# Patient Record
Sex: Female | Born: 1935 | Race: White | Hispanic: No | Smoking: Never smoker
Health system: Southern US, Community
[De-identification: ages and names within clinical notes are randomized; demographics above are authoritative.]

## PROBLEM LIST (undated history)

## (undated) DIAGNOSIS — E785 Hyperlipidemia, unspecified: Secondary | ICD-10-CM

## (undated) DIAGNOSIS — F32A Depression, unspecified: Secondary | ICD-10-CM

## (undated) DIAGNOSIS — K219 Gastro-esophageal reflux disease without esophagitis: Secondary | ICD-10-CM

## (undated) DIAGNOSIS — F329 Major depressive disorder, single episode, unspecified: Secondary | ICD-10-CM

## (undated) DIAGNOSIS — I10 Essential (primary) hypertension: Secondary | ICD-10-CM

## (undated) DIAGNOSIS — M1990 Unspecified osteoarthritis, unspecified site: Secondary | ICD-10-CM

## (undated) DIAGNOSIS — M810 Age-related osteoporosis without current pathological fracture: Secondary | ICD-10-CM

## (undated) HISTORY — DX: Depression, unspecified: F32.A

## (undated) HISTORY — DX: Essential (primary) hypertension: I10

## (undated) HISTORY — DX: Age-related osteoporosis without current pathological fracture: M81.0

## (undated) HISTORY — DX: Major depressive disorder, single episode, unspecified: F32.9

## (undated) HISTORY — DX: Gastro-esophageal reflux disease without esophagitis: K21.9

## (undated) HISTORY — DX: Hyperlipidemia, unspecified: E78.5

## (undated) HISTORY — DX: Unspecified osteoarthritis, unspecified site: M19.90

---

## 1983-09-30 HISTORY — PX: BRAIN SURGERY: SHX531

## 2000-05-26 ENCOUNTER — Other Ambulatory Visit: Admission: RE | Admit: 2000-05-26 | Discharge: 2000-05-26 | Payer: Self-pay | Admitting: Internal Medicine

## 2002-06-08 ENCOUNTER — Encounter: Payer: Self-pay | Admitting: Emergency Medicine

## 2002-06-08 ENCOUNTER — Emergency Department (HOSPITAL_COMMUNITY): Admission: EM | Admit: 2002-06-08 | Discharge: 2002-06-08 | Payer: Self-pay | Admitting: Emergency Medicine

## 2005-05-12 ENCOUNTER — Ambulatory Visit: Payer: Self-pay | Admitting: Gastroenterology

## 2005-05-18 ENCOUNTER — Emergency Department (HOSPITAL_COMMUNITY): Admission: EM | Admit: 2005-05-18 | Discharge: 2005-05-18 | Payer: Self-pay | Admitting: Emergency Medicine

## 2005-06-04 ENCOUNTER — Ambulatory Visit: Payer: Self-pay | Admitting: Gastroenterology

## 2005-06-04 ENCOUNTER — Encounter (INDEPENDENT_AMBULATORY_CARE_PROVIDER_SITE_OTHER): Payer: Self-pay | Admitting: Specialist

## 2008-05-23 ENCOUNTER — Ambulatory Visit: Payer: Self-pay | Admitting: Gastroenterology

## 2008-06-07 ENCOUNTER — Encounter: Payer: Self-pay | Admitting: Gastroenterology

## 2008-06-07 ENCOUNTER — Ambulatory Visit: Payer: Self-pay | Admitting: Gastroenterology

## 2008-06-07 LAB — HM COLONOSCOPY

## 2008-06-09 ENCOUNTER — Encounter: Payer: Self-pay | Admitting: Gastroenterology

## 2010-10-25 ENCOUNTER — Other Ambulatory Visit: Payer: Self-pay | Admitting: Dermatology

## 2012-01-30 ENCOUNTER — Other Ambulatory Visit: Payer: Self-pay | Admitting: Dermatology

## 2013-01-06 ENCOUNTER — Other Ambulatory Visit: Payer: Self-pay | Admitting: Dermatology

## 2013-10-19 ENCOUNTER — Encounter: Payer: Self-pay | Admitting: Podiatry

## 2013-10-19 ENCOUNTER — Ambulatory Visit (INDEPENDENT_AMBULATORY_CARE_PROVIDER_SITE_OTHER): Payer: Medicare Other | Admitting: Podiatry

## 2013-10-19 VITALS — BP 141/86 | HR 64 | Resp 12

## 2013-10-19 DIAGNOSIS — L84 Corns and callosities: Secondary | ICD-10-CM

## 2013-10-19 NOTE — Progress Notes (Signed)
Patient ID: Breanna Yang, female   DOB: [birth year], 80 y.o.   MRN: [id number]  Subjective: This orientated x3 white female presents complaining of painful corns on  The right and left feet.  Objective: Hyperkeratotic tissue noted on the right hallux and second right toe. Hyperkeratotic tissue also present on left hallux, second toe, third toe fourth toe.  Assessment: Multiple hyperkeratotic lesions right and left toes.  Plan: All hyperkeratotic lesions were debrided back without a bleeding. Patient wears toe separators and will continue to do so.  Reappoint at patient's request.

## 2014-03-13 ENCOUNTER — Encounter: Payer: Self-pay | Admitting: Podiatry

## 2014-03-13 ENCOUNTER — Ambulatory Visit (INDEPENDENT_AMBULATORY_CARE_PROVIDER_SITE_OTHER): Payer: Medicare Other | Admitting: Podiatry

## 2014-03-13 VITALS — BP 130/79 | HR 84 | Resp 18

## 2014-03-13 DIAGNOSIS — L84 Corns and callosities: Secondary | ICD-10-CM

## 2014-03-13 NOTE — Progress Notes (Signed)
Patient ID: Breanna Yang, female   DOB: 05-Jul-1934, 80 y.o.   MRN: [id number] Subjective: Orientated x3 white female  Objective: Hyperkeratoses medial second left toe and lateral aspect of the left hallux with hemorrhagic areas.  Assessment: Keratoses x2  Plan: Debrided keratoses x2 Pad lesion second left toe  Reappoint as needed at patient's request

## 2014-06-21 ENCOUNTER — Ambulatory Visit (INDEPENDENT_AMBULATORY_CARE_PROVIDER_SITE_OTHER): Payer: Medicare Other | Admitting: Podiatry

## 2014-06-21 DIAGNOSIS — L84 Corns and callosities: Secondary | ICD-10-CM

## 2014-06-22 NOTE — Progress Notes (Signed)
Patient ID: Breanna Yang, female   DOB: 03-Oct-1933, 80 y.o.   MRN: [id number]  Subjective: This patient presents complaining of painful keratoses on the hallux and second toes bilaterally  Objective: Keratoses noted on the medial lateral borders of the hallux and second toes bilaterally  Assessment: Keratoses x4  Plan: Debrided keratoses x4 Apply foam pads to the second toes bilaterally and attach with one-inch coflex  Tape Instructed patient how to apply foam pads and attach with Coflex tape and the size importance of not over tightening the tape.  Reappoint at patient's request

## 2014-10-03 ENCOUNTER — Encounter: Payer: Self-pay | Admitting: Rehabilitative and Restorative Service Providers"

## 2014-10-03 ENCOUNTER — Ambulatory Visit: Payer: Medicare Other | Attending: Internal Medicine | Admitting: Rehabilitative and Restorative Service Providers"

## 2014-10-03 DIAGNOSIS — R269 Unspecified abnormalities of gait and mobility: Secondary | ICD-10-CM | POA: Diagnosis present

## 2014-10-03 NOTE — Addendum Note (Signed)
Addended by: Rudell Cobb M on: 10/03/2014 02:42 PM   Modules accepted: Orders

## 2014-10-03 NOTE — Therapy (Signed)
[facility] [address] [place], [place], [address] Phone: [phone]   Fax:  [phone]  Physical Therapy Evaluation  Patient Details  Name: Breanna Yang MRN: [id number] Date of Birth: 1934/03/05  Encounter Date: 10/03/2014      PT End of Session - 10/03/14 1412    Visit Number 1  G code (1)   Number of Visits 8   Date for PT Re-Evaluation 11/03/14   PT Start Time 1020   PT Stop Time 1105   PT Time Calculation (min) 45 min   Equipment Utilized During Treatment Gait belt   Activity Tolerance Patient tolerated treatment well      Past Medical History  Diagnosis Date  . Hyperlipidemia     Past Surgical History  Procedure Laterality Date  . Brain surgery  1985    anneurysm    There were no vitals taken for this visit.  Visit Diagnosis:  Abnormality of gait      Subjective Assessment - 10/03/14 1026    Symptoms The patient describes dizziness as a backwards pulling sensation initially felt in November when standing outside on unlevel surfaces.  She  reports veering during ambulation.  She denies dizziness when lying down in bed or getting up.  She describes this as an intermittent sensation that comes and goes.  She reports she had experienced some dizziness initially after her aneurysm, but that had improved.  She reports frequent headache since aneurysm repair.   Patient Stated Goals Patient stopped driving and wants to find out what is wrong.   Currently in Pain? No.   Pain Score 0-No pain          [facility] PT Assessment - 10/03/14 1038    Assessment   Medical Diagnosis dizziness   Onset Date --  07/2014   Balance Screen   Has the patient fallen in the past 6 months No   Has the patient had a decrease in activity level because of a fear of falling?  No   Is the patient reluctant to leave their home because of a fear of falling?  No   Home Environment   Living Enviornment Private residence   Living Arrangements Alone  husband passed away Aug 28, 2014   Type of [place] to enter   Entrance Stairs-Number of Steps 2   Entrance Stairs-Rails Right   Home Layout One level   Prior Function   Level of Independence Independent with basic ADLs;Independent with homemaking with ambulation   Observation/Other Assessments   Focus on Therapeutic Outcomes (FOTO)  73%   Ambulation/Gait   Ambulation/Gait Yes   Ambulation/Gait Assistance 7: Independent   Ambulation Distance (Feet) 200 Feet   Gait Pattern Wide base of support  veering from midline, inconsistent steps   Gait velocity 1.71 ft/sec   Stairs Yes   Stairs Assistance 6: Modified independent (Device/Increase time)   Stair Management Technique One rail Right;Alternating pattern   Number of Stairs --  4   Standardized Balance Assessment   Standardized Balance Assessment Berg Balance Test   Berg Balance Test   Sit to Stand Able to stand without using hands and stabilize independently   Standing Unsupported Able to stand safely 2 minutes   Sitting with Back Unsupported but Feet Supported on Floor or Stool Able to sit safely and securely 2 minutes   Stand to Sit Sits safely with minimal use of hands   Transfers Able to transfer  safely, minor use of hands   Standing Unsupported with Eyes Closed Able to stand 3 seconds   Standing Ubsupported with Feet Together Needs help to attain position but able to stand for 30 seconds with feet together   From Standing, Reach Forward with Outstretched Arm Can reach forward >12 cm safely (5")   From Standing Position, Pick up Object from [place] to pick up shoe safely and easily   From Standing Position, Turn to Look Behind Over each Shoulder Looks behind one side only/other side shows less weight shift   Turn 360 Degrees Needs close supervision or verbal cueing   Standing Unsupported, Alternately Place Feet on Step/Stool Able to complete 4 steps without aid or supervision    Standing Unsupported, One Foot in Front Able to take small step independently and hold 30 seconds   Standing on One Leg Tries to lift leg/unable to hold 3 seconds but remains standing independently   Total Score 39/56 indicating high fall risk            Vestibular Assessment - 10/03/14 1038    General Observation --  Absent R hearing from anneurysm   Type of Dizziness Imbalance  unsteady with head/body turns   Frequency of Dizziness --  intermittent, episodic   Duration of Dizziness --  hours   Aggravating Factors Turning body quickly;Turning head quickly   Relieving Factors Rest   Occulomotor Alignment Abnormal  glasses with prisms due to vision impairment s/p anneurysm   Gaze-induced Right beating nystagmus with R gaze   Smooth Pursuits --  limited ocular ROM, pt moves head to compensate   VOR 1 Head Only (x 1 viewing) --  limited ROM with head turns while maintaining gaze on target   VOR Cancellation Unable to maintain gaze  "fuzzy" sensation with vision during testing   Comment slow pace   Dix-Hallpike Dix-Hallpike Right;Dix-Hallpike Left   Sidelying Test Sidelying Right;Sidelying Left   Horizontal Canal Testing Horizontal Canal Right;Horizontal Canal Left   Dix-Hallpike Right Symptoms No nystagmus  unless in R gaze   Dix-Hallpike Left Symptoms No nystagmus   Sidelying Right Duration --  with R gaze; no subjective reports of dizziness   Sidelying Right Symptoms Upbeat, right rotatory nystagmus  only in R eye noted, only with R gaze   Sidelying Left Symptoms No nystagmus  no symptoms   Horizontal Canal Right Symptoms Ageotrophic  mild nystagmus noted, no dizziness reported   Horizontal Canal Left Symptoms Ageotrophic  "not as clear" with L horizontal roll, indicates R horiz cupulolithiasis             PT Education - 10/03/14 1412    Education provided Yes   Education Details PT goals and plan of care.   Person(s) Educated Patient   Methods Explanation    Comprehension Verbalized understanding          PT Short Term Goals - 10/03/14 1429    PT SHORT TERM GOAL #1   Title The patient will be indep with HEP for balance, mobility.  Target date 11/02/2014   Time 4   Period Weeks   PT SHORT TERM GOAL #2   Title The patient will improve Berg score to > or equal to 44/56 to demo decreasing risk for falls.  Target date 11/02/2014   Time 4   Period Weeks   PT SHORT TERM GOAL #3   Title The patient will improve gait speed to > or equal to 2.0 ft/sec to demo  decreasing risk for falls.  Target date 11/02/2014   Time 4   Period Weeks   PT SHORT TERM GOAL #4   Title The patient will be further assessed on sensory organization testing and goal to follow, if indicated.  Target date 11/02/2014   Time 4   Period Weeks           PT Long Term Goals - 10/04/2014 1431    PT LONG TERM GOAL #1   Title The patient will be indep with post d/c HEP.  Target date 3/4/206   Time 8   Period Weeks   PT LONG TERM GOAL #2   Title The patient will improve Berg score to > or equal to 48/56 to demo decreased risk for falls.  Target date 12/01/2014   Time 8   Period Weeks   PT LONG TERM GOAL #3   Title The patient will improve gait speed to > or equal to 2.3 ft/sec to demo improving functional mobility.  Target date 12/01/2014   Time 8   Period Weeks   PT LONG TERM GOAL #4   Title The patient will negotiate unlevel surfaces without a device independently for return to prior functional status without loss of balance.  Target date 12/01/2014   Time 8   Period Weeks               Plan - 10/04/2014 1414    Clinical Impression Statement The patient is a 81 yo female with h/o brain aneurysm 1985 presenting today with recent reports of declining balance and intermittent episodes of imbalance, described as dizziness.  She reports recent increase in stress due to her husband passing away in October and her daughter going through hard times.  She also has visual limitations  (in ROM, smooth pursuits and nystagmus with R end gaze) compensated for with use of prisms in her lenses.  PT was able to provoke an ageotropic nystagmus with L horizontal rolling, however this did not correlate with subjective reports of dizziness and may be due to abnormal visual exam. The patient appears to have a mix of central factors (abnormal visual exam), possible peripheral vertigo (noted by nystagmus with L horizontal roll test) and central decompensation from prior brain surgery due to stress creating worsening balance and declining gait speed.   The patient requests PT to perform 1x/week sessions due to her not currently driving.   Pt will benefit from skilled therapeutic intervention in order to improve on the following deficits Abnormal gait;Difficulty walking;Decreased balance;Decreased mobility;Decreased strength   Rehab Potential Good   Clinical Impairments Affecting Rehab Potential h/o brain surgery   PT Frequency 2x / week  however pt able to come 1x/week   PT Duration 8 weeks (longer duration if needed due to 1x/week freq)   PT Treatment/Interventions Therapeutic activities;Patient/family education;Therapeutic exercise;Gait training;Balance training;Neuromuscular re-education;Functional mobility training   PT Next Visit Plan HEP: gaze x 1 viewing, habituation horizontal rolling, standing balance (per Merrilee Jansky) activities, gait activities, SOT   Consulted and Agree with Plan of Care Patient           G-Codes - 04-Oct-2014 1433    Functional Assessment Tool Used Berg=39/56, gait speed=1.71 ft/sec   Functional Limitation Mobility: Walking and moving around   Mobility: Walking and Moving Around Current Status (P7106) At least 20 percent but less than 40 percent impaired, limited or restricted   Mobility: Walking and Moving Around Goal Status (Y6948) At least 1 percent but less than 20 percent  impaired, limited or restricted       Problem List There are no active problems to display  for this patient.   [place], [place] 10/03/2014, 2:35 PM  [facility] [address] [place], [place], [address] Phone: [phone]   Fax:  [phone]

## 2014-10-13 ENCOUNTER — Encounter: Payer: Medicare Other | Admitting: Rehabilitative and Restorative Service Providers"

## 2014-10-16 ENCOUNTER — Encounter: Payer: Medicare Other | Admitting: Rehabilitative and Restorative Service Providers"

## 2014-10-16 DIAGNOSIS — R269 Unspecified abnormalities of gait and mobility: Secondary | ICD-10-CM | POA: Diagnosis not present

## 2014-10-16 NOTE — Therapy (Signed)
[facility] [address] [place], [place], [address] Phone: [phone]   Fax:  [phone]  Patient Details  Name: Breanna Yang MRN: [id number] Date of Birth: [birth year] Referring Provider:  No ref. provider found  Encounter Date: 10/16/2014  PHYSICAL THERAPY DISCHARGE SUMMARY  Visits from Start of Care: eval only  Current functional level related to goals / functional outcomes: *See evaluation for patient status-she did not return due to difficulties with transportation.  Pt scored in high fall risk category during evaluation.   Remaining deficits: See evaluation for patient status.   Education / Equipment: n/a  Plan: Patient agrees to discharge.  Patient goals were not met. Patient is being discharged due to not returning since the last visit.  ?????    Thank you for the referral of this patient.    [place], PT 10/16/2014, 1:46 PM  [facility] [address] [place], [place], [address] Phone: [phone]   Fax:  [phone]

## 2014-10-20 ENCOUNTER — Encounter: Payer: Medicare Other | Admitting: Rehabilitative and Restorative Service Providers"

## 2014-10-27 ENCOUNTER — Encounter: Payer: Medicare Other | Admitting: Rehabilitative and Restorative Service Providers"

## 2015-03-13 ENCOUNTER — Ambulatory Visit (INDEPENDENT_AMBULATORY_CARE_PROVIDER_SITE_OTHER): Payer: Medicare Other | Admitting: Podiatry

## 2015-03-13 ENCOUNTER — Encounter: Payer: Self-pay | Admitting: Podiatry

## 2015-03-13 DIAGNOSIS — L84 Corns and callosities: Secondary | ICD-10-CM

## 2015-03-13 NOTE — Patient Instructions (Signed)
Apply antibody biotic ointment to the second right toe daily and cover with a Band-Aid until a scab forms

## 2015-03-13 NOTE — Progress Notes (Signed)
Patient ID: Breanna Yang, female   DOB: April 10, 1934, 81 y.o.   MRN: [id number]  Subjective: This patient presents complaining of painful keratoses on the right and left toes. She wears toe separators on a continuous basis  Objective: Right foot Keratoses medial second right toe  Left foot Keratoses lateral left hallux Medial & lateral second  Lateral third  Medial fourth  Assessment: Keratoses 6  Plan: Debrided keratoses 6 Slight bleeding second right toe treated with anabolic ointment and Band-Aid. Patient made aware of this and was advised to continue applying topical anabolic ointment and Band-Aid until a scab forms.  Reappoint at patient's request

## 2015-04-13 ENCOUNTER — Other Ambulatory Visit (HOSPITAL_COMMUNITY): Payer: Self-pay | Admitting: Internal Medicine

## 2015-04-13 ENCOUNTER — Ambulatory Visit (HOSPITAL_COMMUNITY)
Admission: RE | Admit: 2015-04-13 | Discharge: 2015-04-13 | Disposition: A | Discharge status: Home / Self Care | Payer: Medicare Other | Source: Ambulatory Visit | Attending: Internal Medicine | Admitting: Internal Medicine

## 2015-04-13 DIAGNOSIS — Z1231 Encounter for screening mammogram for malignant neoplasm of breast: Secondary | ICD-10-CM

## 2015-04-13 LAB — HM MAMMOGRAPHY

## 2015-09-07 LAB — BASIC METABOLIC PANEL
BUN: 28 mg/dL — AB (ref 4–21)
CREATININE: 0.9 mg/dL (ref <=1.1)
Glucose: 95 mg/dL
Potassium: 4.4 mmol/L (ref 3.4–5.3)
SODIUM: 149 mmol/L — AB (ref 137–147)

## 2015-09-07 LAB — CBC AND DIFFERENTIAL
HCT: 36 % (ref 36–46)
HEMOGLOBIN: 12.2 g/dL (ref 12.0–16.0)
Platelets: 196 10*3/uL (ref 150–399)
WBC: 5.1 10^3/mL

## 2015-09-07 LAB — HEPATIC FUNCTION PANEL
ALT: 26 U/L (ref 7–35)
AST: 30 U/L (ref 13–35)
Alkaline Phosphatase: 58 U/L (ref 25–125)
BILIRUBIN, TOTAL: 0.5 mg/dL

## 2015-09-07 LAB — LIPID PANEL
Cholesterol: 171 mg/dL (ref 0–200)
HDL: 49 mg/dL (ref 35–70)
LDL Cholesterol: 100 mg/dL
TRIGLYCERIDES: 111 mg/dL (ref 40–160)

## 2015-11-16 ENCOUNTER — Encounter: Payer: Self-pay | Admitting: Gastroenterology

## 2016-04-11 ENCOUNTER — Encounter: Payer: Self-pay | Admitting: General Practice

## 2016-06-20 ENCOUNTER — Other Ambulatory Visit: Payer: Self-pay | Admitting: Family Medicine

## 2016-06-20 DIAGNOSIS — Z1231 Encounter for screening mammogram for malignant neoplasm of breast: Secondary | ICD-10-CM

## 2016-07-03 ENCOUNTER — Encounter: Payer: Self-pay | Admitting: Emergency Medicine

## 2016-07-03 ENCOUNTER — Telehealth: Payer: Self-pay | Admitting: Emergency Medicine

## 2016-07-03 NOTE — Telephone Encounter (Signed)
Pre-Visit Call completed with patient and chart updated.   Pre-Visit Info documented in Specialty Comments under [facility].    

## 2016-07-04 ENCOUNTER — Encounter: Payer: Self-pay | Admitting: Family Medicine

## 2016-07-04 ENCOUNTER — Ambulatory Visit (INDEPENDENT_AMBULATORY_CARE_PROVIDER_SITE_OTHER): Payer: Medicare Other | Admitting: Family Medicine

## 2016-07-04 ENCOUNTER — Other Ambulatory Visit: Payer: Self-pay | Admitting: General Practice

## 2016-07-04 VITALS — BP 122/78 | HR 75 | Temp 98.9°F | Resp 16 | Ht 64.75 in | Wt 135.5 lb

## 2016-07-04 DIAGNOSIS — M810 Age-related osteoporosis without current pathological fracture: Secondary | ICD-10-CM | POA: Insufficient documentation

## 2016-07-04 DIAGNOSIS — F329 Major depressive disorder, single episode, unspecified: Secondary | ICD-10-CM

## 2016-07-04 DIAGNOSIS — Z23 Encounter for immunization: Secondary | ICD-10-CM

## 2016-07-04 DIAGNOSIS — I10 Essential (primary) hypertension: Secondary | ICD-10-CM | POA: Insufficient documentation

## 2016-07-04 DIAGNOSIS — F32A Depression, unspecified: Secondary | ICD-10-CM | POA: Insufficient documentation

## 2016-07-04 DIAGNOSIS — E785 Hyperlipidemia, unspecified: Secondary | ICD-10-CM | POA: Insufficient documentation

## 2016-07-04 LAB — CBC WITH DIFFERENTIAL/PLATELET
BASOS PCT: 0.5 % (ref 0.0–3.0)
Basophils Absolute: 0 10*3/uL (ref 0.0–0.1)
EOS ABS: 0 10*3/uL (ref 0.0–0.7)
EOS PCT: 0.7 % (ref 0.0–5.0)
HEMATOCRIT: 35.1 % — AB (ref 36.0–46.0)
HEMOGLOBIN: 12 g/dL (ref 12.0–15.0)
LYMPHS PCT: 24.9 % (ref 12.0–46.0)
Lymphs Abs: 1.3 10*3/uL (ref 0.7–4.0)
MCHC: 34.2 g/dL (ref 30.0–36.0)
MCV: 94.8 fl (ref 78.0–100.0)
MONOS PCT: 10.2 % (ref 3.0–12.0)
Monocytes Absolute: 0.5 10*3/uL (ref 0.1–1.0)
NEUTROS ABS: 3.3 10*3/uL (ref 1.4–7.7)
Neutrophils Relative %: 63.7 % (ref 43.0–77.0)
Platelets: 190 10*3/uL (ref 150.0–400.0)
RBC: 3.71 Mil/uL — ABNORMAL LOW (ref 3.87–5.11)
RDW: 13.7 % (ref 11.5–15.5)
WBC: 5.2 10*3/uL (ref 4.0–10.5)

## 2016-07-04 LAB — BASIC METABOLIC PANEL
BUN: 19 mg/dL (ref 6–23)
CALCIUM: 9.5 mg/dL (ref 8.4–10.5)
CHLORIDE: 104 meq/L (ref 96–112)
CO2: 29 meq/L (ref 19–32)
CREATININE: 0.76 mg/dL (ref 0.40–1.20)
GFR: 77.73 mL/min (ref >60.00)
GLUCOSE: 81 mg/dL (ref 70–99)
Potassium: 4 mEq/L (ref 3.5–5.1)
Sodium: 139 mEq/L (ref 135–145)

## 2016-07-04 LAB — LIPID PANEL
CHOL/HDL RATIO: 3
Cholesterol: 139 mg/dL (ref 0–200)
HDL: 42.6 mg/dL (ref >39.00)
LDL CALC: 65 mg/dL (ref 0–99)
NONHDL: 95.92
Triglycerides: 157 mg/dL — ABNORMAL HIGH (ref 0.0–149.0)
VLDL: 31.4 mg/dL (ref 0.0–40.0)

## 2016-07-04 LAB — HEPATIC FUNCTION PANEL
ALK PHOS: 50 U/L (ref 39–117)
ALT: 18 U/L (ref 0–35)
AST: 22 U/L (ref 0–37)
Albumin: 3.9 g/dL (ref 3.5–5.2)
BILIRUBIN DIRECT: 0 mg/dL (ref 0.0–0.3)
BILIRUBIN TOTAL: 0.6 mg/dL (ref 0.2–1.2)
TOTAL PROTEIN: 7.9 g/dL (ref 6.0–8.3)

## 2016-07-04 LAB — TSH: TSH: 2.14 u[IU]/mL (ref 0.35–4.50)

## 2016-07-04 LAB — VITAMIN D 25 HYDROXY (VIT D DEFICIENCY, FRACTURES): VITD: 26.33 ng/mL — ABNORMAL LOW (ref 30.00–100.00)

## 2016-07-04 MED ORDER — VITAMIN D (ERGOCALCIFEROL) 1.25 MG (50000 UNIT) PO CAPS: 50000.0000 [IU] | ORAL_CAPSULE | ORAL | 0 refills | Status: DC

## 2016-07-04 NOTE — Progress Notes (Signed)
   Subjective:    Patient ID: Breanna Yang, female    DOB: Aug 23, 1934, 82 y.o.   MRN: [id number]  HPI New to establish.  Previous MD- [facility]  HTN- chronic problem, on Metoprolol w/ good control.  No CP, SOB, HAs above baseline, visual changes, edema.  Hyperlipidemia- chronic problem, on Simvastatin nightly.  Denies abd pain, N/V, myalgias  Depression- chronic problem since husband's death.  On Paxil 20mg  daily w/ good control.  Denies difficulty w/ sleeping, energy level, motivation.  Difficulty w/ balance- since aneurysm surgery.  Has been to physical therapy- 'they have sent me everywhere!'.  Pt is aware of her unsteadiness but is not interested in f/u at this time.  It is not interfering w/ daily activities.     Review of Systems For ROS see HPI     Objective:   Physical Exam  Constitutional: She is oriented to person, place, and time. She appears well-developed and well-nourished. No distress.  HENT:  Head: Normocephalic and atraumatic.  Eyes: Conjunctivae and EOM are normal. Pupils are equal, round, and reactive to light.  Neck: Normal range of motion. Neck supple. No thyromegaly present.  Cardiovascular: Normal rate, regular rhythm, normal heart sounds and intact distal pulses.   No murmur heard. Pulmonary/Chest: Effort normal and breath sounds normal. No respiratory distress.  Abdominal: Soft. She exhibits no distension. There is no tenderness.  Musculoskeletal: She exhibits no edema.  Lymphadenopathy:    She has no cervical adenopathy.  Neurological: She is alert and oriented to person, place, and time.  Skin: Skin is warm and dry.  Psychiatric: She has a normal mood and affect. Her behavior is normal.  Vitals reviewed.         Assessment & Plan:

## 2016-07-04 NOTE — Progress Notes (Signed)
Pre visit review using our clinic review tool, if applicable. No additional management support is needed unless otherwise documented below in the visit note. 

## 2016-07-04 NOTE — Assessment & Plan Note (Signed)
New to provider, ongoing for pt.  Tolerating statin w/o difficulty.  Check labs.  Adjust meds prn  

## 2016-07-04 NOTE — Assessment & Plan Note (Signed)
New to provider, ongoing for pt.  Well controlled.  Asymptomatic.  Check labs.  No anticipated med changes.  Will follow.

## 2016-07-04 NOTE — Assessment & Plan Note (Signed)
New to provider, ongoing for pt.  Currently well controlled on Paxil.  No changes at this time.  Will follow.

## 2016-07-04 NOTE — Assessment & Plan Note (Signed)
New to provider, ongoing for pt.  No recent DEXA.  Check Vit D level and get updated DEXA.  Pt expressed understanding and is in agreement w/ plan.

## 2016-07-04 NOTE — Patient Instructions (Signed)
Schedule your complete physical in 6 months We'll notify you of your lab results and make any changes if needed We'll call [facility] with [facility] Keep up the good work on healthy diet and regular exercise- you look great! Call with any questions or concerns Welcome!  We're glad to have you!!!

## 2016-07-04 NOTE — Addendum Note (Signed)
Addended by: Desmond Dike L on: 07/04/2016 11:00 AM   Modules accepted: Orders

## 2016-07-09 ENCOUNTER — Other Ambulatory Visit: Payer: Self-pay | Admitting: Family Medicine

## 2016-07-09 DIAGNOSIS — Z780 Asymptomatic menopausal state: Secondary | ICD-10-CM

## 2016-07-29 ENCOUNTER — Ambulatory Visit
Admission: RE | Admit: 2016-07-29 | Discharge: 2016-07-29 | Disposition: A | Discharge status: Home / Self Care | Payer: Medicare Other | Source: Ambulatory Visit | Attending: Family Medicine | Admitting: Family Medicine

## 2016-07-29 DIAGNOSIS — Z780 Asymptomatic menopausal state: Secondary | ICD-10-CM

## 2016-07-29 DIAGNOSIS — Z1231 Encounter for screening mammogram for malignant neoplasm of breast: Secondary | ICD-10-CM

## 2016-07-30 ENCOUNTER — Encounter: Payer: Self-pay | Admitting: General Practice

## 2016-08-06 ENCOUNTER — Telehealth: Payer: Self-pay | Admitting: Family Medicine

## 2016-08-06 MED ORDER — SIMVASTATIN 40 MG PO TABS: 40.0000 mg | ORAL_TABLET | Freq: Every day | ORAL | 6 refills | Status: DC

## 2016-08-06 NOTE — Telephone Encounter (Signed)
Medication filled to pharmacy as requested.   

## 2016-08-06 NOTE — Telephone Encounter (Signed)
Requesting refill of simvastatin (ZOCOR) 40 MG tablet sent to [facility] [place]

## 2016-09-04 ENCOUNTER — Other Ambulatory Visit: Payer: Self-pay | Admitting: Family Medicine

## 2016-09-04 MED ORDER — PAROXETINE HCL 20 MG PO TABS: 20.0000 mg | ORAL_TABLET | Freq: Every day | ORAL | 6 refills | Status: DC

## 2016-09-04 NOTE — Telephone Encounter (Signed)
Last OV 07/04/2016 paxil listed on current med list but this has never been filled by our office.

## 2016-09-04 NOTE — Telephone Encounter (Signed)
Caller states that pt needs a refill on Paroxetine, [facility] [place].

## 2016-09-08 ENCOUNTER — Other Ambulatory Visit: Payer: Self-pay | Admitting: General Practice

## 2016-09-08 MED ORDER — METOPROLOL SUCCINATE ER 50 MG PO TB24: 50.0000 mg | ORAL_TABLET | Freq: Every day | ORAL | 6 refills | Status: DC

## 2016-10-10 ENCOUNTER — Ambulatory Visit (INDEPENDENT_AMBULATORY_CARE_PROVIDER_SITE_OTHER): Payer: Self-pay

## 2016-10-10 DIAGNOSIS — Z23 Encounter for immunization: Secondary | ICD-10-CM

## 2016-11-04 ENCOUNTER — Telehealth: Payer: Self-pay | Admitting: Family Medicine

## 2016-11-04 NOTE — Telephone Encounter (Signed)
[facility]     Patient Name: [facility] Initial Comment Caller says she had a brain aneurism. She is experiencing weakness and hasn't been able to eat a lot. Last night she passed out/blacked out with no warning.   DOB: 1934/04/19      Nurse Assessment  Nurse: Venetia Maxon, RN, Manuela Schwartz Date/Time (Eastern Time): 11/04/2016 4:47:26 PM  Confirm and document reason for call. If symptomatic, describe symptoms. ---Caller says she had a brain aneurism. She is experiencing weakness and hasn't been able to eat a lot. Last night she passed out/blacked out with no warning. She was in her home and landed on concrete . out for 3 minutes. she lives with her dtr.  Does the patient have any new or worsening symptoms? ---Yes  Will a triage be completed? ---Yes  Related visit to physician within the last 2 weeks? ---No  Does the PT have any chronic conditions? (i.e. diabetes, asthma, etc.) ---Yes  List chronic conditions. ---HTN cholesterol a long time ago she passed out  Is this a behavioral health or substance abuse call? ---No    Guidelines     Guideline Title Affirmed Question Affirmed Notes   Fainting [1] Age > 50 years AND [2] now alert and feels fine    Final Disposition User   Go to ED Now (or PCP triage) Venetia Maxon, RN, Manuela Schwartz     Referrals   GO TO FACILITY OTHER - SPECIFY   Disagree/Comply: Comply

## 2016-11-05 ENCOUNTER — Encounter (HOSPITAL_COMMUNITY): Payer: Self-pay | Admitting: Emergency Medicine

## 2016-11-05 ENCOUNTER — Emergency Department (HOSPITAL_COMMUNITY): Payer: Medicare HMO

## 2016-11-05 ENCOUNTER — Emergency Department (HOSPITAL_COMMUNITY)
Admission: EM | Admit: 2016-11-05 | Discharge: 2016-11-05 | Disposition: A | Discharge status: Home / Self Care | Payer: Medicare HMO | Attending: Emergency Medicine | Admitting: Emergency Medicine

## 2016-11-05 DIAGNOSIS — Z7982 Long term (current) use of aspirin: Secondary | ICD-10-CM | POA: Insufficient documentation

## 2016-11-05 DIAGNOSIS — I10 Essential (primary) hypertension: Secondary | ICD-10-CM | POA: Insufficient documentation

## 2016-11-05 DIAGNOSIS — Z79899 Other long term (current) drug therapy: Secondary | ICD-10-CM | POA: Diagnosis not present

## 2016-11-05 DIAGNOSIS — S069X9A Unspecified intracranial injury with loss of consciousness of unspecified duration, initial encounter: Secondary | ICD-10-CM | POA: Diagnosis not present

## 2016-11-05 DIAGNOSIS — Y929 Unspecified place or not applicable: Secondary | ICD-10-CM | POA: Diagnosis not present

## 2016-11-05 DIAGNOSIS — Y999 Unspecified external cause status: Secondary | ICD-10-CM | POA: Diagnosis not present

## 2016-11-05 DIAGNOSIS — W19XXXA Unspecified fall, initial encounter: Secondary | ICD-10-CM | POA: Insufficient documentation

## 2016-11-05 DIAGNOSIS — Y939 Activity, unspecified: Secondary | ICD-10-CM | POA: Diagnosis not present

## 2016-11-05 DIAGNOSIS — R55 Syncope and collapse: Secondary | ICD-10-CM | POA: Insufficient documentation

## 2016-11-05 LAB — URINALYSIS, ROUTINE W REFLEX MICROSCOPIC
Bilirubin Urine: NEGATIVE
Glucose, UA: NEGATIVE mg/dL
Hgb urine dipstick: NEGATIVE
Ketones, ur: NEGATIVE mg/dL
LEUKOCYTES UA: NEGATIVE
NITRITE: NEGATIVE
PH: 5 (ref 5.0–8.0)
Protein, ur: NEGATIVE mg/dL
SPECIFIC GRAVITY, URINE: 1.023 (ref 1.005–1.030)

## 2016-11-05 LAB — BASIC METABOLIC PANEL
ANION GAP: 6 (ref 5–15)
BUN: 28 mg/dL — ABNORMAL HIGH (ref 6–20)
CO2: 27 mmol/L (ref 22–32)
Calcium: 8.9 mg/dL (ref 8.9–10.3)
Chloride: 104 mmol/L (ref 101–111)
Creatinine, Ser: 0.76 mg/dL (ref 0.44–1.00)
GFR calc non Af Amer: >60 mL/min (ref >60)
Glucose, Bld: 87 mg/dL (ref 65–99)
POTASSIUM: 3.8 mmol/L (ref 3.5–5.1)
SODIUM: 137 mmol/L (ref 135–145)

## 2016-11-05 LAB — CBC
HEMATOCRIT: 32.1 % — AB (ref 36.0–46.0)
HEMOGLOBIN: 10.8 g/dL — AB (ref 12.0–15.0)
MCH: 31.9 pg (ref 26.0–34.0)
MCHC: 33.6 g/dL (ref 30.0–36.0)
MCV: 94.7 fL (ref 78.0–100.0)
Platelets: 164 10*3/uL (ref 150–400)
RBC: 3.39 MIL/uL — AB (ref 3.87–5.11)
RDW: 13.3 % (ref 11.5–15.5)
WBC: 5.3 10*3/uL (ref 4.0–10.5)

## 2016-11-05 NOTE — Telephone Encounter (Signed)
Spoke with patient regarding symptoms. Patient states on Monday (11/03/16) evening she was outside and passed out, reports no injuries. Patient did not want to go the Emergency Department because she does not drive and didn't want her sister to have to wait. Patient reports feeling weak and unsteady yesterday with decreased appetite x 1 month. Patient states she has a history of a brain aneurism in 1985 and has experienced weakness and unsteady gait several times. Patient advised to go the Emergency Department for evaluation and encouraged to call 911 in the future for similar events. Patient states she is going to [facility] this morning.

## 2016-11-05 NOTE — ED Provider Notes (Signed)
[facility] DEPT Provider Note   CSN: [id number] Arrival date & time: 11/05/16  0957  History   Chief Complaint Chief Complaint  Patient presents with  . Fall    HPI Breanna Yang is a 83 y.o. female.  HPI  83 y.o. 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 Pt states that she ambulated outside to wave goodbye to her family when she went unconscious. No CP/SOB/ABD prior to syncope. No aura. Pt states that he family said she was out for around 20 minutes. Did not choose to go to ED that day. Since then, pt notes unsteady gait as well as decrease in appetite. Pt states that this is similar to when she had an aneurysm in the past. Notes weakness over 1 month as well. No active pain currently. Mild headache. No visual changes. No N/V/D. No other symptoms noted.   Past Medical History:  Diagnosis Date  . Depression   . GERD (gastroesophageal reflux disease)   . Hyperlipidemia   . Hypertension   . Osteoarthritis   . Osteoporosis     Patient Active Problem List   Diagnosis Date Noted  . HTN (hypertension) 07/04/2016  . Hyperlipidemia 07/04/2016  . Depression 07/04/2016  . Osteoporosis 07/04/2016    Past Surgical History:  Procedure Laterality Date  . BRAIN SURGERY  1985   anneurysm    OB History    No data available       Home Medications    Prior to Admission medications   Medication Sig Start Date End Date Taking? Authorizing Provider  aspirin 81 MG tablet Take 81 mg by mouth daily.    Historical Provider, MD  Cholecalciferol (VITAMIN D) 2000 units tablet Take 2,000 Units by mouth daily.    Historical Provider, MD  metoprolol succinate (TOPROL-XL) 50 MG 24 hr tablet Take 1 tablet (50 mg total) by mouth daily. Take with or immediately following a meal. 09/08/16   Midge Minium, MD  Multiple Vitamin (MULTIVITAMIN) capsule Take 1 capsule by mouth daily.     Historical Provider, MD  PARoxetine (PAXIL) 20 MG tablet Take 1 tablet (20 mg total) by mouth daily. 09/04/16   Midge Minium, MD  simvastatin (ZOCOR) 40 MG tablet Take 1 tablet (40 mg total) by mouth daily at 6 PM. 08/06/16   Midge Minium, MD  vitamin B-12 (CYANOCOBALAMIN) 500 MCG tablet Take 500 mcg by mouth daily.    Historical Provider, MD  Vitamin D, Ergocalciferol, (DRISDOL) 50000 units CAPS capsule Take 1 capsule (50,000 Units total) by mouth every 7 (seven) days. 07/04/16   Midge Minium, MD    Family History Family History  Problem Relation Age of Onset  . Cancer Sister     Social History Social History  Substance Use Topics  . Smoking status: Never Smoker  . Smokeless tobacco: Never Used  . Alcohol use No     Allergies   Patient has no known allergies.   Review of Systems Review of Systems ROS reviewed and all are negative for acute change except as noted in the HPI.  Physical Exam Updated Vital Signs BP 127/58   Pulse (!) 58   Temp 97.9 F (36.6 C) (Oral)   Resp 15   SpO2 97%   Physical Exam  Constitutional: She is oriented to person, place, and time. Vital signs are normal. She appears well-developed and well-nourished.  HENT:  Head: Normocephalic and atraumatic.  Right Ear: Hearing normal.  Left Ear: Hearing normal.  Eyes: Conjunctivae and EOM are normal. Pupils are equal, round, and reactive to light.  Neck: Normal range of motion. Neck supple.  Cardiovascular: Normal rate, regular rhythm, normal heart sounds and intact distal pulses.   No murmur heard. Pulmonary/Chest: Effort normal and breath sounds normal. No respiratory distress. She has no wheezes.  Abdominal: There is no tenderness.  Musculoskeletal: Normal range of motion.  Neurological: She is alert and oriented to person, place, and time. She has normal strength. No cranial nerve deficit or sensory deficit.  Cranial Nerves:  II: Pupils equal, round, reactive to light III,IV,  VI: ptosis not present, extra-ocular motions intact bilaterally  V,VII: smile symmetric, facial light touch sensation equal VIII: hearing grossly normal bilaterally  IX,X: midline uvula rise  XI: bilateral shoulder shrug equal and strong XII: midline tongue extension Finger to nose exam unremarkable. Negative pronator drift.   Skin: Skin is warm and dry.  Psychiatric: She has a normal mood and affect. Her speech is normal and behavior is normal. Thought content normal.  Nursing note and vitals reviewed.  ED Treatments / Results  Labs (all labs ordered are listed, but only abnormal results are displayed) Labs Reviewed  CBC - Abnormal; Notable for the following:       Result Value   RBC 3.39 (*)    Hemoglobin 10.8 (*)    HCT 32.1 (*)    All other components within normal limits  BASIC METABOLIC PANEL - Abnormal; Notable for the following:    BUN 28 (*)    All other components within normal limits  URINALYSIS, ROUTINE W REFLEX MICROSCOPIC   EKG  EKG Interpretation None      Radiology Ct Head Wo Contrast  Result Date: 11/05/2016 CLINICAL DATA:  [place] 3 days ago with loss of consciousness. History of brain aneurysm. Worsening weakness. EXAM: CT HEAD WITHOUT CONTRAST TECHNIQUE: Contiguous axial images were obtained from the base of the skull through the vertex without intravenous contrast. COMPARISON:  03/17/2007 FINDINGS: Brain: Previous occipital craniectomy. Aneurysm clip in the lateral aspect of the posterior fossa on the right. Encephalomalacia of the inferior cerebellum right more than left. Cerebral hemispheres show chronic small-vessel ischemic changes throughout the white matter which are progressive over time. Old right frontal burr hole with focal encephalomalacia under that probably related to ventriculostomy placement. Ventricular size is stable accounting for atrophy. No hemorrhage, mass or extra-axial collection. Vascular: No acute vascular finding. Skull: Postoperative  changes as noted above. Sinuses/Orbits: Clear/normal Other: None significant IMPRESSION: Old posterior fossa aneurysm clipping on the right. Occipital craniectomy. 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 Right frontal encephalomalacia due to previous ventriculostomy. Atrophy and chronic small-vessel ischemic changes of the white matter, progressive since 2008. The ventricles are larger, but this is roughly in proportion to the progressive white matter disease. Electronically Signed   By: Nelson Chimes M.D.   On: 11/05/2016 14:23   Procedures Procedures (including critical care time)  Medications Ordered in ED Medications - No data to display  Initial Impression / Assessment and Plan / ED Course  I have reviewed the triage vital signs and the nursing notes.  Pertinent labs & imaging results that were available during my care of the patient were reviewed by me and considered in my medical decision making (see chart for details).    Final Clinical Impressions(s) / ED Diagnoses  {I have reviewed and evaluated  the relevant laboratory values. {I have reviewed and evaluated the relevant imaging studies. {I have interpreted the relevant EKG. {I have reviewed the relevant previous healthcare records.  {I obtained HPI from historian. {Patient discussed with supervising physician.  ED Course:  Assessment: Pt is a 82yF with hx Brain Aneurysm in the 1980s, HTN, HLD who presents with syncopal episode Monday without cause. No N/V. No aura. No seizure. No CP/SOB. Notes hx of same with aneurysm in past with unsteady gait. On exam, pt in NAD. Nontoxic/nonseptic appearing. VSS. Afebrile. Lungs CTA. Heart RRR. Abdomen nontender soft. CN evaluated and unremarkable. No acute neurological findings. CBC unremarkable. BMP unremarkable. UA unremarkable.  CT Head showed old posterior fossa aneursym clipping on right. Occipital craniotomy noted. Encephalomalacia of cerebellum on right > left. This could  explain gait abnormalities recently. No acute findings. EKG unremarkable. Seen by supervising physician. Called PCP and arranged close follow up for tomorrow morning at 0900. Plan is to [place]. At time of discharge, Patient is in no acute distress. Vital Signs are stable. Patient is able to ambulate. Patient able to tolerate PO.   Disposition/Plan:  DC Home Additional Verbal discharge instructions given and discussed with patient.  Pt Instructed to f/u with PCP in the next week for evaluation and treatment of symptoms. Return precautions given Pt acknowledges and agrees with plan  Supervising Physician Fatima Blank, MD  Final diagnoses:  Syncope, unspecified syncope type    New Prescriptions New Prescriptions   No medications on file     Shary Decamp, PA-C 11/05/16 [place], MD 11/06/16 1359

## 2016-11-05 NOTE — Telephone Encounter (Signed)
Agree w/ advice given 

## 2016-11-05 NOTE — Discharge Instructions (Addendum)
Please read and follow all provided instructions.  Your diagnoses today include:  1. Syncope, unspecified syncope type     Tests performed today include: Vital signs. See below for your results today.   Medications prescribed:  Take as prescribed   Home care instructions:  Follow any educational materials contained in this packet.  Follow-up instructions: Please follow-up with your primary care provider for further evaluation of symptoms and treatment TOMORROW AT 9 AM  Return instructions:  Please return to the Emergency Department if you do not get better, if you get worse, or new symptoms OR  - Fever (temperature greater than 101.30F)  - Bleeding that does not stop with holding pressure to the area    -Severe pain (please note that you may be more sore the day after your accident)  - Chest Pain  - Difficulty breathing  - Severe nausea or vomiting  - Inability to tolerate food and liquids  - Passing out  - Skin becoming red around your wounds  - Change in mental status (confusion or lethargy)  - New numbness or weakness    Please return if you have any other emergent concerns.  Additional Information:  Your vital signs today were: BP 124/57    Pulse 62    Temp 97.9 F (36.6 C) (Oral)    Resp 16    SpO2 96%  If your blood pressure (BP) was elevated above 135/85 this visit, please have this repeated by your doctor within one month. ---------------

## 2016-11-05 NOTE — ED Triage Notes (Signed)
Pt fell on Monday and had LOC; pt is inclined to falling due to brain aneurysm in 1985; pt states she has been having increased weakness for a month; pt also states she's been having trouble eating lately; ambulatory in triage; NAD noted

## 2016-11-05 NOTE — ED Notes (Signed)
Patient transported to CT 

## 2016-11-05 NOTE — ED Notes (Signed)
ED Provider at bedside. 

## 2016-11-06 ENCOUNTER — Encounter: Payer: Self-pay | Admitting: Physician Assistant

## 2016-11-06 ENCOUNTER — Ambulatory Visit (INDEPENDENT_AMBULATORY_CARE_PROVIDER_SITE_OTHER): Payer: Medicare HMO | Admitting: Physician Assistant

## 2016-11-06 VITALS — BP 128/70 | HR 81 | Temp 98.4°F | Resp 14 | Ht 65.0 in | Wt 133.0 lb

## 2016-11-06 DIAGNOSIS — R55 Syncope and collapse: Secondary | ICD-10-CM

## 2016-11-06 DIAGNOSIS — K219 Gastro-esophageal reflux disease without esophagitis: Secondary | ICD-10-CM

## 2016-11-06 DIAGNOSIS — R2689 Other abnormalities of gait and mobility: Secondary | ICD-10-CM | POA: Diagnosis not present

## 2016-11-06 DIAGNOSIS — G9389 Other specified disorders of brain: Secondary | ICD-10-CM | POA: Diagnosis not present

## 2016-11-06 MED ORDER — RANITIDINE HCL 150 MG PO TABS: 150.0000 mg | ORAL_TABLET | Freq: Two times a day (BID) | ORAL | 0 refills | Status: DC

## 2016-11-06 NOTE — Progress Notes (Signed)
Patient with HLD and remote history of anuerysm s/p repair in 1985 presents to clinic today for ER follow-up. Patient presented to [facility] ER yesterday c/o of syncopal episode occurring on Monday (2 days prior). Patient endorses standing and waving to her family when she apparently lost consciousness. Family noted it took about 10 minutes for her to completely "wake up". No EMS was called at that time. Patient denies any noted trauma or injury. States she felt well before and afterwards. No proceeding lightheadedness or dizziness. Denies anterograde and retrograde amnesia. Since discharge, patient endorses doing very well overall. Denies any dizziness, lightheadedness, syncope. Denies ever having chest pain, racing heart or shortness of breath. Has been hydrating well. Has also been eating well. ER workup included CT Head revealing prior surgical changes along with chronic small-vessel ischemic changes and encephalomalacia of the cerebellum with R greater than L.  Patient has noted long-term difficulty with her balance. Notes feeling off balance when walking from time to time. No dizziness or lightheadedness during this episodes. Denies any noted focal weakness, difficulty with speech or understanding. Denies change to memory. Denies history of stroke or heart attack. Is on 81 mg ASA daily and Simvastatin 40 for hyperlipidemia. Last LDL at 65 on 07/04/16-- very well-controlled.   Patient does note some sensation of globus from time to time. Denies dysphagia. Occasional indigestion and morning congestion. Denies epigastric pain, nausea or vomiting.   Past Medical History:  Diagnosis Date  . Depression   . GERD (gastroesophageal reflux disease)   . Hyperlipidemia   . Hypertension   . Osteoarthritis   . Osteoporosis     Current Outpatient Prescriptions on File Prior to Visit  Medication Sig Dispense Refill  . aspirin 81 MG tablet Take 81 mg by mouth daily.    . Cholecalciferol (VITAMIN D) 2000  units tablet Take 2,000 Units by mouth daily.    . metoprolol succinate (TOPROL-XL) 50 MG 24 hr tablet Take 1 tablet (50 mg total) by mouth daily. Take with or immediately following a meal. 30 tablet 6  . Multiple Vitamin (MULTIVITAMIN) capsule Take 1 capsule by mouth daily.    . naproxen sodium (ANAPROX) 220 MG tablet Take 440 mg by mouth 2 (two) times daily with a meal.    . simvastatin (ZOCOR) 40 MG tablet Take 1 tablet (40 mg total) by mouth daily at 6 PM. 30 tablet 6  . vitamin B-12 (CYANOCOBALAMIN) 500 MCG tablet Take 500 mcg by mouth daily.    Marland Kitchen PARoxetine (PAXIL) 20 MG tablet Take 1 tablet (20 mg total) by mouth daily. (Patient not taking: Reported on 11/06/2016) 30 tablet 6   No current facility-administered medications on file prior to visit.     No Known Allergies  Family History  Problem Relation Age of Onset  . Cancer Sister     Social History   Social History  . Marital status: Married    Spouse name: N/A  . Number of children: N/A  . Years of education: N/A   Social History Main Topics  . Smoking status: Never Smoker  . Smokeless tobacco: Never Used  . Alcohol use No  . Drug use: No  . Sexual activity: Not Asked   Other Topics Concern  . None   Social History Narrative  . None   Review of Systems - See HPI.  All other ROS are negative.  BP 128/70   Pulse 81   Temp 98.4 F (36.9 C) (Oral)   Resp  14   Ht 5' 5"  (1.651 m)   Wt 133 lb (60.3 kg)   SpO2 98%   BMI 22.13 kg/m   Physical Exam  Constitutional: She is oriented to person, place, and time and well-developed, well-nourished, and in no distress.  HENT:  Head: Normocephalic and atraumatic.  Eyes: Conjunctivae are normal.  Cardiovascular: Normal rate, regular rhythm, normal heart sounds and intact distal pulses.   Pulmonary/Chest: Effort normal and breath sounds normal. No respiratory distress. She has no wheezes. She has no rales. She exhibits no tenderness.  Neurological: She is alert and  oriented to person, place, and time. She has normal sensation, normal strength and intact cranial nerves. She displays no weakness and facial symmetry. She exhibits normal muscle tone. She has an abnormal Cerebellar Exam and an abnormal Tandem Gait Test. Gait abnormal.  Skin: Skin is warm and dry. No rash noted.  Psychiatric: Affect normal.  Vitals reviewed.  Recent Results (from the past 2160 hour(s))  Urinalysis, Routine w reflex microscopic     Status: None   Collection Time: 11/05/16 12:46 PM  Result Value Ref Range   Color, Urine YELLOW YELLOW   APPearance CLEAR CLEAR   Specific Gravity, Urine 1.023 1.005 - 1.030   pH 5.0 5.0 - 8.0   Glucose, UA NEGATIVE NEGATIVE mg/dL   Hgb urine dipstick NEGATIVE NEGATIVE   Bilirubin Urine NEGATIVE NEGATIVE   Ketones, ur NEGATIVE NEGATIVE mg/dL   Protein, ur NEGATIVE NEGATIVE mg/dL   Nitrite NEGATIVE NEGATIVE   Leukocytes, UA NEGATIVE NEGATIVE  CBC     Status: Abnormal   Collection Time: 11/05/16  1:06 PM  Result Value Ref Range   WBC 5.3 4.0 - 10.5 K/uL   RBC 3.39 (L) 3.87 - 5.11 MIL/uL   Hemoglobin 10.8 (L) 12.0 - 15.0 g/dL   HCT 32.1 (L) 36.0 - 46.0 %   MCV 94.7 78.0 - 100.0 fL   MCH 31.9 26.0 - 34.0 pg   MCHC 33.6 30.0 - 36.0 g/dL   RDW 13.3 11.5 - 15.5 %   Platelets 164 150 - 400 K/uL  Basic metabolic panel     Status: Abnormal   Collection Time: 11/05/16  1:06 PM  Result Value Ref Range   Sodium 137 135 - 145 mmol/L   Potassium 3.8 3.5 - 5.1 mmol/L   Chloride 104 101 - 111 mmol/L   CO2 27 22 - 32 mmol/L   Glucose, Bld 87 65 - 99 mg/dL   BUN 28 (H) 6 - 20 mg/dL   Creatinine, Ser 0.76 0.44 - 1.00 mg/dL   Calcium 8.9 8.9 - 10.3 mg/dL   GFR calc non Af Amer >60 >60 mL/min   GFR calc Af Amer >60 >60 mL/min    Comment: (NOTE) The eGFR has been calculated using the CKD EPI equation. This calculation has not been validated in all clinical situations. eGFR's persistently <60 mL/min signify possible Chronic Kidney Disease.     Anion gap 6 5 - 15    Assessment/Plan: 1. 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 Recent syncopal event. Ct without acute findings. Referral to Neurology and PT placed. Continue chronic medications at present.  - Ambulatory referral to Neurology  2. Balance disorder Long-standing. - Ambulatory referral to Neurology - Ambulatory referral to Physical Therapy  3. Syncope Unspecified. Negative ER workup. Stable today but with abnormal cerebellar function consistent with encephalomalacia noted on CT. Chronic for her but has not had Neuro assessment. . Referral to  Neuro placed. No driving until Neuro assessment. ER immediately if any recurrence occurs.   3. Gastroesophageal reflux disease without esophagitis Start Zantac and GERD diet.  - ranitidine (ZANTAC) 150 MG tablet; Take 1 tablet (150 mg total) by mouth 2 (two) times daily.  Dispense: 60 tablet; Refill: 0   Follow-up scheduled with PCP. Leeanne Rio, PA-C

## 2016-11-06 NOTE — Progress Notes (Signed)
Pre visit review using our clinic review tool, if applicable. No additional management support is needed unless otherwise documented below in the visit note. 

## 2016-11-06 NOTE — Patient Instructions (Addendum)
Please stayl well-hydrated and eat a well-balanced diet. Your esophageal symptoms seem related to silent reflux.  We are attempting a trial of Zantac twice daily.  Follow dietary regimen at the bottom.  Follow-up with me in 1 week for reassessment. If no improvement, I would want to get Gastroenterology on board to to an upper endoscopy in case you have a narrowing of the esophagus.   In regards to the syncopal episode, we need further assessment.  CT was negative for any acute findings. Did show chronic changes contributing to your balance issue. I am setting you up urgently with Neurology for further assessment and management.   I am also setting you up with therapy for balance/gait training.   Will need neurology assessment before you can be cleared to drive again.  If you note any recurrence of symptoms -- lightheadedness or dizziness, etc, please call 911.

## 2016-11-13 ENCOUNTER — Telehealth: Payer: Self-pay | Admitting: Family Medicine

## 2016-11-13 ENCOUNTER — Encounter: Payer: Self-pay | Admitting: Family Medicine

## 2016-11-13 ENCOUNTER — Ambulatory Visit (INDEPENDENT_AMBULATORY_CARE_PROVIDER_SITE_OTHER): Payer: Medicare HMO | Admitting: Family Medicine

## 2016-11-13 DIAGNOSIS — G9389 Other specified disorders of brain: Secondary | ICD-10-CM

## 2016-11-13 DIAGNOSIS — K219 Gastro-esophageal reflux disease without esophagitis: Secondary | ICD-10-CM

## 2016-11-13 DIAGNOSIS — R413 Other amnesia: Secondary | ICD-10-CM

## 2016-11-13 NOTE — Telephone Encounter (Signed)
Patient's daughter called to get an update on mother's appointment today. Please call daughter [name] back at [phone] (she is on [place]).  Thank you.

## 2016-11-13 NOTE — Telephone Encounter (Signed)
Please advise what I should tell the pt daughter?

## 2016-11-13 NOTE — Telephone Encounter (Signed)
You can tell her that she seems to be doing well but there were noticeable memory deficits today (she didn't know who I was) and I found this concerning.  Sister reports this is not new for her.  She has a neuro appt upcoming for a complete evaluation.  She is not able to drive until Neuro clears her and given her memory issues, they may not.  But physically, she is doing well.

## 2016-11-13 NOTE — Assessment & Plan Note (Signed)
Noted on CT scan done last week.  Pt and sister report that balance issues are not new, nor are her memory issues.  Pt did not understand why she was to see Neuro or have PT.  Explained to both pt and sister.  She wanted me to clear her for driving but after unexplained, witnessed syncope I told her this was not possible until after a neuro evaluation and even then it is in question.  Will follow.

## 2016-11-13 NOTE — Assessment & Plan Note (Signed)
Pt reports sxs are much improved w/ addition of Zantac.  No changes at this time.  Will follow.

## 2016-11-13 NOTE — Patient Instructions (Signed)
Follow up as scheduled Please go to the neurologist to try and determine a cause for your passing out and the memory loss You'll go to the Physical Therapist to work on balance You are not allowed to drive until neurology clears you- this is not safe! Continue the Zantac Call with any questions or concerns Hang in there!!!

## 2016-11-13 NOTE — Progress Notes (Signed)
Pre visit review using our clinic review tool, if applicable. No additional management support is needed unless otherwise documented below in the visit note. 

## 2016-11-13 NOTE — Progress Notes (Signed)
   Subjective:    Patient ID: Breanna Yang, female    DOB: 1934-02-11, 83 y.o.   MRN: [id number]  HPI Syncope f/u- pt has not had any additional episodes since passing out on 11/03/16.  Pt reports feeling well.  Pt has been 'walking quite a bit' [place].  Rarely having dizziness.  Pt is not interested in PT but is willing to see Neuro.  Pt has no memory of ever having seen me before.  GERD- pt was started on Zantac at last visit.  Pt reports feeling much better.  'that's a miracle drug'.   Review of Systems For ROS see HPI     Objective:   Physical Exam  Constitutional: She appears well-developed and well-nourished. No distress.  HENT:  Head: Normocephalic and atraumatic.  Neurological: She is alert.  Pt is oriented to person but not place or time- does not recognize me or office staff  Skin: Skin is warm and dry.  Psychiatric: She has a normal mood and affect. Her behavior is normal. Thought content normal.  Vitals reviewed.         Assessment & Plan:

## 2016-11-13 NOTE — Telephone Encounter (Signed)
Patient daughter notified of PCP recommendations and is agreement and expresses an understanding.    

## 2016-11-13 NOTE — Assessment & Plan Note (Signed)
New to provider, sister reports this is ongoing.  Neither the pt nor her sister seem concerned by her inability to recognize me.  Sister states, 'she does this all the time.  She'll forget what we do'.  Again, refer to neuro for complete evaluation.

## 2016-11-17 ENCOUNTER — Ambulatory Visit: Payer: Medicare HMO | Attending: Physician Assistant | Admitting: Physical Therapy

## 2016-11-17 DIAGNOSIS — M6281 Muscle weakness (generalized): Secondary | ICD-10-CM | POA: Diagnosis present

## 2016-11-17 DIAGNOSIS — R2681 Unsteadiness on feet: Secondary | ICD-10-CM | POA: Diagnosis not present

## 2016-11-17 NOTE — Patient Instructions (Signed)
Piriformis (Supine)  You may lay on your bed and place your foot on the headboard. Cross legs, right on top. Gently pull other knee toward chest until stretch is felt in buttock/hip of top leg. Hold __60__ seconds. Repeat ____ times per set. Do ____ sets per session. Do __2-3__ sessions per day.    Or do the following:    Hip Stretch  Put right ankle over left knee. Let right knee fall downward, but keep ankle in place. Feel the stretch in hip. May push down gently with hand to feel stretch. Hold _60___ seconds while counting out loud. Repeat with other leg. Repeat _3___ times. Do 2-3____ sessions per day.  Madelyn Flavors, PT 11/17/16 11:14 AM  [facility] [place], [place], [address] Phone: [phone]   Fax:  [phone]

## 2016-11-17 NOTE — Therapy (Addendum)
[facility] [place], [place], [address] Phone: [phone]   Fax:  [phone]  Physical Therapy Evaluation  Patient Details  Name: Breanna Yang MRN: [id number] Date of Birth: 1934/06/23 Referring Provider: Raiford Noble  Encounter Date: 11/17/2016      PT End of Session - 11/17/16 1115    Visit Number 1   Number of Visits 16   Date for PT Re-Evaluation 01/12/17   PT Start Time 6314   PT Stop Time 1116   PT Time Calculation (min) 41 min   Activity Tolerance Patient tolerated treatment well   Behavior During Therapy [facility] for tasks assessed/performed      Past Medical History:  Diagnosis Date  . Depression   . GERD (gastroesophageal reflux disease)   . Hyperlipidemia   . Hypertension   . Osteoarthritis   . Osteoporosis     Past Surgical History:  Procedure Laterality Date  . BRAIN SURGERY  1985   anneurysm    There were no vitals filed for this visit.       Subjective Assessment - 11/17/16 1039    Subjective Patient states she fell three weeks ago when walking out to front porch. She passed out and she fell backwards. This happens about twice a year.   Pertinent History 1985 aneurysm with R sided deficits, HTN   Currently in Pain? No/denies            [facility] PT Assessment - 11/17/16 0001      Assessment   Medical Diagnosis Balance Disorder   Referring Provider Raiford Noble   Onset Date/Surgical Date 08/29/16     Precautions   Precautions Fall     Balance Screen   Has the patient fallen in the past 6 months Yes   How many times? 1   Has the patient had a decrease in activity level because of a fear of falling?  Yes   Is the patient reluctant to leave their home because of a fear of falling?  Yes     [place] Private residence   Living Arrangements Children   Type of [place] to enter   Entrance Stairs-Number of Steps 2   Entrance  Stairs-Rails Can reach both   [place] One level   [place] - 4 wheels;Cane - quad;Cane - single point  three     Prior Function   Level of Independence Independent   Vocation Retired     Charity fundraiser Status Within Functional Limits for tasks assessed     ROM / Strength   AROM / PROM / Strength Strength;AROM     AROM   Overall AROM Comments L cervical rotation decreased 70 deg and B SB limited 80%     Strength   Overall Strength Comments grossly 5/5 BLE in sitting; B hip ABD 4- in [facility]     Standardized Balance Assessment   Standardized Balance Assessment Five Times Sit to Stand;Berg Balance Test   Five times sit to stand comments  16 seconds     Berg Balance Test   Sit to Stand Able to stand without using hands and stabilize independently   Standing Unsupported Able to stand safely 2 minutes   Sitting with Back Unsupported but Feet Supported on Floor or Stool Able to sit safely and securely 2 minutes   Stand to Sit Sits safely with minimal use of hands  Transfers Able to transfer safely, minor use of hands   Standing Unsupported with Eyes Closed Able to stand 10 seconds safely   Standing Ubsupported with Feet Together Able to place feet together independently and stand 1 minute safely   From Standing, Reach Forward with Outstretched Arm Can reach confidently >25 cm (10")   From Standing Position, Pick up Object from Floor Able to pick up shoe safely and easily   From Standing Position, Turn to Look Behind Over each Shoulder Looks behind from both sides and weight shifts well   Turn 360 Degrees Able to turn 360 degrees safely but slowly   Standing Unsupported, Alternately Place Feet on Step/Stool Able to stand independently and safely and complete 8 steps in 20 seconds   Standing Unsupported, One Foot in [employer] balance while stepping or standing  a little unsteady   Standing on One Leg Tries to lift leg/unable to hold 3 seconds but remains  standing independently  L; unable on R   Total Score 47                           PT Education - 11/17/16 1125    Education provided Yes   Education Details HEP   Person(s) Educated Patient   Methods Explanation;Demonstration;Handout;Verbal cues;Tactile cues   Comprehension Verbalized understanding;Returned demonstration             PT Long Term Goals - 11/17/16 1126      PT LONG TERM GOAL #1   Title I with HEP   Time 8   Period Weeks   Status New     PT LONG TERM GOAL #2   Title The patient will improve Berg score to > or equal to 52/56 to demo decreased risk for falls.   Time 8   Period Weeks   Status New     PT LONG TERM GOAL #3   Title Patient will improve 5 times sit to stand to 8 seconds to improve functional mobility.   Time 8   Period Weeks   Status New     PT LONG TERM GOAL #4   Title The patient will negotiate unlevel surfaces without loss of balance.    Time 8   Period Weeks   Status New     PT LONG TERM GOAL #5   Title Patient will improve B hip ABD strength to 4+/5 to help improve gait stablity   Time 8   Period Weeks   Status New               Plan - 11/17/16 1122    Clinical Impression Statement Patient presents today with balance concerns due to intermittent falls. She scored 47/56 on the BERG and overall has good balance except with higher level activities. She is unsteady with gait with multiple LOB witnessed of which patient was able to correct I. She has tight R hip rotators and weak glut med B and will benefit from balance and strengthening.   Rehab Potential Excellent   PT Frequency 2x / week   PT Duration 8 weeks   PT Treatment/Interventions ADLs/Self Care Home Management;Patient/family education;Manual techniques;Therapeutic exercise;Balance training;Neuromuscular re-education;Gait training   PT Next Visit Plan high level balance; SDLY hip ABD (be strict with form)   PT Home Exercise Plan piriformis stretch     Consulted and Agree with Plan of Care Patient      Patient will benefit from skilled therapeutic  intervention in order to improve the following deficits and impairments:  Abnormal gait, Decreased balance, Impaired flexibility  Visit Diagnosis: Unsteadiness on feet - Plan: PT plan of care cert/re-cert  Muscle weakness (generalized) - Plan: PT plan of care cert/re-cert      G-Codes - 03/47/42 1135    Functional Limitation Mobility: Walking and moving around   Mobility: Walking and Moving Around Current Status [phone]) At least 1 percent but less than 20 percent impaired, limited or restricted   Mobility: Walking and Moving Around Goal Status ([id number]) At least 1 percent but less than 20 percent impaired, limited or restricted       Problem List Patient Active Problem List   Diagnosis Date Noted  . GERD (gastroesophageal reflux disease) 11/13/2016  . Encephalomalacia 11/13/2016  . Memory loss 11/13/2016  . HTN (hypertension) 07/04/2016  . Hyperlipidemia 07/04/2016  . Depression 07/04/2016  . Osteoporosis 07/04/2016    Madelyn Flavors PT 11/17/2016, 11:40 AM  [facility] [place], [place], [address] Phone: [phone]   Fax:  [phone]  Name: MEGIN CONSALVO MRN: [id number] Date of Birth: [birth year]  PHYSICAL THERAPY DISCHARGE SUMMARY  Visits from Start of Care: 1  Current functional level related to goals / functional outcomes: SEE ABOVE   Remaining deficits: SEE ABOVE   Education / Equipment: HEP Plan: Patient agrees to discharge.  Patient goals were not met. Patient is being discharged due to not returning since the last visit.  ?????    Madelyn Flavors, PT 06/24/18 11:50 AM' [facility] [address] [place], [place], [address] Phone: [phone]   Fax:  [phone]

## 2016-12-03 ENCOUNTER — Encounter: Payer: Self-pay | Admitting: Neurology

## 2016-12-03 ENCOUNTER — Ambulatory Visit (INDEPENDENT_AMBULATORY_CARE_PROVIDER_SITE_OTHER): Payer: Medicare HMO | Admitting: Neurology

## 2016-12-03 VITALS — BP 108/68 | HR 88 | Ht 65.0 in | Wt 133.1 lb

## 2016-12-03 DIAGNOSIS — R55 Syncope and collapse: Secondary | ICD-10-CM

## 2016-12-03 DIAGNOSIS — G3184 Mild cognitive impairment, so stated: Secondary | ICD-10-CM

## 2016-12-03 DIAGNOSIS — R2681 Unsteadiness on feet: Secondary | ICD-10-CM

## 2016-12-03 NOTE — Patient Instructions (Addendum)
1. Schedule MRI brain with and without contrast. We have sent a referral to [facility] for your MRI and they will call you directly to schedule your appt. They are located at [place]. If you need to contact them directly please call [phone]. 2. Schedule routine EEG 3. Refer to Physical Therapy for Balance Therapy. We will send referral and they will call you directly to schedule.  4. As per [place] driving laws, no driving after an episode of loss of consciousness until 6 months event-free 5. Follow-up in 4 months, call for any changes

## 2016-12-03 NOTE — Progress Notes (Signed)
NEUROLOGY CONSULTATION NOTE  Breanna Yang MRN: [id number] DOB: 1934/02/06  Referring provider: Dr. Annye Yang Primary care provider: Dr. Annye Yang  Reason for consult:  Syncope, memory loss, balance problems  Dear Breanna Yang:  Thank you for your kind referral of Breanna Yang for consultation of the above symptoms. She is accompanied by her sister who helps supplement the history today. Her daughter sent a letter prior to the appointment regarding her concerns. Although her history is well known to you, please allow me to reiterate it for the purpose of our medical record. The patient was accompanied to the clinic by her sister who also provides collateral information. Records and images were personally reviewed where available.  HISTORY OF PRESENT ILLNESS: This is an 83 year old right-handed woman with a history of hypertension, hyperlipidemia, brain aneurysm s/p clipping in the 1980s, presenting for evaluation of syncope, balance problems, and memory loss.   1. Syncope. She had a syncopal episode and was brought to the ER on 11/03/16. She states she had ran out of fuel and had no heat. Per ER notes, she was waving goodbye to family when she passed out with no prior warning symptoms. She denied any post-event confusion or focal weakness, no headache.She declined to go to the ER but went 2 days later due to unsteady gait and decreased appetite. She felt this was similar to when she was found to have an aneurysm in the past. She had reported a month history of weakness as well and mild headache. Vital signs were stable, pulse was 58 bpm. Bloodwork unremarkable. 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 She  was discharged home in stable condition and denies any further similar symptoms. She denies any staring/unresponsive episodes, gaps in time, olfactory/gustatory hallucinations, deja vu, rising epigastric sensation, focal numbness/tingling/weakness, myoclonic jerks. She denies any dizziness, palpitations, chest pain, shortness of breath. She has not been driving since then.  2. Balance problems: She and her sister do noticed some balance issues, she denies any falls but states she might wobble once in a while. She states she was told by her neurosurgeon that this may happen. She exercises and walks down her driveway. She denies any focal numbness/tingling/weakness, no neck/back pain, no bowel/bladder dysfunction.   3. Memory issues. She and her sister report her memory is good. She lives with her daughter who is in charge of bills. She denies any missed medications and states she writes them down. When she was driving, she denied getting lost. Her sister lives close by and sees her on a daily basis. She states her mood is great, her sister says "It's hateful," she is impatient and gets upset when her sister does not pick her up on time. Her sister denies any personality changes. Her daughter sent a letter detailing her concerns. She has noticed the balance issues and states she has difficulties walking straight without staggering. With driving, she pulls out in front of traffic, often forgets directions, and is very nervous when she drives. She does not hear well, so her daughter is very worried about her driving. Her memory has declined greatly over the last year she has been living with her. She gets details confused or if someone recalls things differently or challenges her thoughts, she gets very  hostile and angry. She has noticed an increase in her anxiety as well and she always seems to be on edge to those close to her. In the past 6 months, she forgets who people are and cannot recall conversations or even  simple details and daily tasks. Her daughter takes care of all her finances, but she has become increasingly concerned because her mother is targeted daily by scammers and she is extremely confused about her finances. She personally thinks she needs to be listed incompetent to handle her own affairs. I reviewed Breanna Yang note as well, the patient did not recall Breanna. Birdie Yang on her last visit, she has seen her several times in the past year. When asked today about her PCP, she answers Breanna. Birdie Yang is her PCP and she has seen her several times and loves her. 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 She has occasional headaches on the frontal or left temporal region since her brain surgery. She has horizontal diplopia without her glasses. No dizziness, dysarthria/dysphagia, neck/back pain, focal numbness/tingling/weakness, bowel/bladder dysfunction. No family history of dementia. Her father had a stroke. No history of concussions. She denies any alcohol use.   PAST MEDICAL HISTORY: Past Medical History:  Diagnosis Date  . Depression   . GERD (gastroesophageal reflux disease)   . Hyperlipidemia   . Hypertension   . Osteoarthritis   . Osteoporosis     PAST SURGICAL HISTORY: Past Surgical History:  Procedure Laterality Date  . BRAIN SURGERY  1985   anneurysm    MEDICATIONS: Current Outpatient Prescriptions on File Prior to Visit  Medication Sig Dispense Refill  . aspirin 81 MG tablet Take 81 mg by mouth daily.    . Cholecalciferol (VITAMIN D) 2000 units tablet Take 2,000 Units by mouth daily.    Marland Kitchen ENSURE (ENSURE) Take 237 mLs by mouth at bedtime.    . metoprolol succinate (TOPROL-XL) 50 MG 24 hr tablet Take 1 tablet (50 mg total) by mouth daily. Take with or immediately following a meal. 30 tablet 6  . Multiple Vitamin (MULTIVITAMIN) capsule Take 1 capsule by mouth daily.    . naproxen sodium (ANAPROX) 220 MG tablet Take  440 mg by mouth 2 (two) times daily with a meal.    . PARoxetine (PAXIL) 20 MG tablet Take 1 tablet (20 mg total) by mouth daily. 30 tablet 6  . ranitidine (ZANTAC) 150 MG tablet Take 1 tablet (150 mg total) by mouth 2 (two) times daily. 60 tablet 0  . simvastatin (ZOCOR) 40 MG tablet Take 1 tablet (40 mg total) by mouth daily at 6 PM. 30 tablet 6  . vitamin B-12 (CYANOCOBALAMIN) 500 MCG tablet Take 500 mcg by mouth daily.     No current facility-administered medications on file prior to visit.     ALLERGIES: No Known Allergies  FAMILY HISTORY: Family History  Problem Relation Age of Onset  . Cancer Sister     SOCIAL HISTORY: Social History   Social History  . Marital status: Married    Spouse name: N/A  . Number of children: N/A  . Years of education: N/A   Occupational History  . Not on file.   Social History Main Topics  . Smoking status: Never Smoker  . Smokeless tobacco: Never Used  . Alcohol use No  . Drug use: No  . Sexual activity: Not on file   Other Topics Concern  .  Not on file   Social History Narrative  . No narrative on file    REVIEW OF SYSTEMS: Constitutional: No fevers, chills, or sweats, no generalized fatigue, change in appetite Eyes: No visual changes, double vision, eye pain Ear, nose and throat: No hearing loss, ear pain, nasal congestion, sore throat Cardiovascular: No chest pain, palpitations Respiratory:  No shortness of breath at rest or with exertion, wheezes GastrointestinaI: No nausea, vomiting, diarrhea, abdominal pain, fecal incontinence Genitourinary:  No dysuria, urinary retention or frequency Musculoskeletal:  No neck pain, back pain Integumentary: No rash, pruritus, skin lesions Neurological: as above Psychiatric: No depression, insomnia, anxiety Endocrine: No palpitations, fatigue, diaphoresis, mood swings, change in appetite, change in weight, increased thirst Hematologic/Lymphatic:  No anemia, purpura,  petechiae. Allergic/Immunologic: no itchy/runny eyes, nasal congestion, recent allergic reactions, rashes  PHYSICAL EXAM: Vitals:   12/03/16 1359  BP: 108/68  Pulse: 88   General: No acute distress Head:  Normocephalic/atraumatic Eyes: Fundoscopic exam shows bilateral sharp discs, no vessel changes, exudates, or hemorrhages Neck: supple, no paraspinal tenderness, full range of motion Back: No paraspinal tenderness Heart: regular rate and rhythm Lungs: Clear to auscultation bilaterally. Vascular: No carotid bruits. Skin/Extremities: No rash, no edema Neurological Exam: Mental status: alert and oriented to person, place, and time, no dysarthria or aphasia, Fund of knowledge is appropriate.  Recent and remote memory are intact.  Attention and concentration are normal.    Able to name objects and repeat phrases.  Montreal Cognitive Assessment  12/03/2016  Visuospatial/ Executive (0/5) 5  Naming (0/3) 2  Attention: Read list of digits (0/2) 2  Attention: Read list of letters (0/1) 1  Attention: Serial 7 subtraction starting at 100 (0/3) 2  Language: Repeat phrase (0/2) 2  Language : Fluency (0/1) 0  Abstraction (0/2) 1  Delayed Recall (0/5) 4  Orientation (0/6) 6  Total 25   Cranial nerves: CN I: not tested CN II: pupils equal, round and reactive to light, visual fields intact, fundi unremarkable. CN III, IV, VI:  full range of motion, no nystagmus, no ptosis CN V: facial sensation intact CN VII: upper and lower face symmetric CN VIII: hearing intact to finger rub CN IX, X: gag intact, uvula midline CN XI: sternocleidomastoid and trapezius muscles intact CN XII: tongue midline Bulk & Tone: normal, no fasciculations. Motor: 5/5 throughout with no pronator drift. Sensation: intact to light touch, cold, pin, vibration and joint position sense.  No extinction to double simultaneous stimulation.  Romberg test negative Deep Tendon Reflexes: +1 throughout except for absent ankle jerks  bilaterally, no ankle clonus Plantar responses: downgoing bilaterally Cerebellar: no incoordination on finger to nose, heel to shin. No dysdiadochokinesia Gait: right foot is everted when walking, able to tandem walk adequately. Tremor: none  IMPRESSION: This is a pleasant 83 year old right-handed woman with a history of  hypertension, hyperlipidemia, brain aneurysm s/p clipping in the 1980s, presenting for evaluation of syncope x 1 last month, balance and memory problems. Her neurological exam is largely non-focal, however she walks with her right foot everted, which could potentially cause the gait issues. Her MOCA score is 25/30, indicating mild cognitive impairment. Etiology of syncopal episode is unclear, with prior history of brain surgery, seizure is considered. She will be scheduled for an MRI brain with and without contrast to assess for underlying structural abnormality and assess vascular load. Order routine EEG. She will be referred for physical therapy for balance therapy.  [place] driving laws were discussed with the  patient, and she knows to stop driving after an episode of loss of consciousness, until 6 months event-free. I spoke to her daughter after the visit on the phone, and discussed her visit, as well as cognitive concerns. I have asked her daughter to come on the next visit so we can discuss Neurocognitive testing with her mother. She will follow-up in 4 months and knows to call for any changes.   Thank you for allowing me to participate in the care of this patient. Please do not hesitate to call for any questions or concerns.   Ellouise Newer, M.D.  CC: Breanna. Birdie Yang

## 2016-12-04 ENCOUNTER — Telehealth: Payer: Self-pay | Admitting: Neurology

## 2016-12-04 NOTE — Telephone Encounter (Signed)
Called patient's daughter and she spoke with patient and no longer has questions.

## 2016-12-04 NOTE — Telephone Encounter (Signed)
PT's daughter [name] left a message saying her mother was in to see Dr Delice Lesch and she wants to know what happened in the appointment/Dawn CB# [phone]

## 2016-12-08 ENCOUNTER — Encounter: Payer: Self-pay | Admitting: Neurology

## 2016-12-11 ENCOUNTER — Ambulatory Visit (INDEPENDENT_AMBULATORY_CARE_PROVIDER_SITE_OTHER): Payer: Medicare HMO | Admitting: Neurology

## 2016-12-11 DIAGNOSIS — R55 Syncope and collapse: Secondary | ICD-10-CM | POA: Diagnosis not present

## 2016-12-16 NOTE — Procedures (Signed)
ELECTROENCEPHALOGRAM REPORT  Date of Study: 12/11/2016  Patient's Name: Breanna Yang MRN: [id number] Date of Birth: 02-20-1934  Referring Provider: Dr. Ellouise Newer  Clinical History: This is an 83 year old woman with a history of cerebral aneurysm and a recent episode of loss of consciousness.  Medications:  aspirin 81 MG VITAMIN D  ENSURE TOPROL-XL MULTIVITAMIN ANAPROX PAXIL ZANTAC ZOCOR CYANOCOBALAMIN  Technical Summary: A multichannel digital EEG recording measured by the international 10-20 system with electrodes applied with paste and impedances below 5000 ohms performed in our laboratory with EKG monitoring in an awake and asleep patient.  Hyperventilation and photic stimulation were not performed.  The digital EEG was referentially recorded, reformatted, and digitally filtered in a variety of bipolar and referential montages for optimal display.    Description: The patient is awake and asleep during the recording.  During maximal wakefulness, there is a symmetric, medium voltage 8-9 Hz posterior dominant rhythm that attenuates with eye opening.  The record is symmetric.  During drowsiness and sleep, there is an increase in theta slowing of the background with rare vertex waves seen.  Hyperventilation and photic stimulation were not performed. There were no epileptiform discharges or electrographic seizures seen.    EKG lead was unremarkable.  Impression: This awake and asleep EEG is normal.    Clinical Correlation: A normal EEG does not exclude a clinical diagnosis of epilepsy.  If further clinical questions remain, prolonged EEG may be helpful.  Clinical correlation is advised.   Ellouise Newer, M.D.

## 2016-12-29 ENCOUNTER — Telehealth: Payer: Self-pay

## 2016-12-29 ENCOUNTER — Ambulatory Visit
Admission: RE | Admit: 2016-12-29 | Discharge: 2016-12-29 | Disposition: A | Discharge status: Home / Self Care | Payer: Medicare HMO | Source: Ambulatory Visit | Attending: Neurology | Admitting: Neurology

## 2016-12-29 DIAGNOSIS — R55 Syncope and collapse: Secondary | ICD-10-CM

## 2016-12-29 DIAGNOSIS — R2681 Unsteadiness on feet: Secondary | ICD-10-CM

## 2016-12-29 DIAGNOSIS — G3184 Mild cognitive impairment, so stated: Secondary | ICD-10-CM

## 2016-12-29 NOTE — Telephone Encounter (Signed)
Received call from [facility] - MRI W/WO contrast can not be done due to pt having aneurysm clip in her head that is not safe for MRI.  Will route this message to Dr. Delice Lesch.

## 2017-01-02 NOTE — Telephone Encounter (Signed)
Noted, thanks!

## 2017-01-05 ENCOUNTER — Telehealth: Payer: Self-pay | Admitting: Family Medicine

## 2017-01-05 NOTE — Telephone Encounter (Signed)
Pt is not supposed to be driving for 6 months (minimum) after her recent syncopal episode.  This is written in neuro's note

## 2017-01-05 NOTE — Telephone Encounter (Signed)
FYI

## 2017-01-05 NOTE — Telephone Encounter (Signed)
Daughter states that she will not be able to come to pt appt for tomorrow and wanted KT to know that she has seen no change in pt since last visit. That pt is still forgetting simple things, that pt will not due PT, daughter is concerned about pt anxiety and asking if her meds could be causing this. Daughter states that she feels pt should not be driving. Daughter is avail for a call back if needed.

## 2017-01-06 ENCOUNTER — Encounter: Payer: Self-pay | Admitting: Family Medicine

## 2017-01-06 ENCOUNTER — Ambulatory Visit (INDEPENDENT_AMBULATORY_CARE_PROVIDER_SITE_OTHER): Payer: Medicare HMO | Admitting: Family Medicine

## 2017-01-06 VITALS — BP 110/66 | HR 63 | Temp 98.0°F | Resp 16 | Ht 65.0 in | Wt 132.2 lb

## 2017-01-06 DIAGNOSIS — E785 Hyperlipidemia, unspecified: Secondary | ICD-10-CM

## 2017-01-06 DIAGNOSIS — M810 Age-related osteoporosis without current pathological fracture: Secondary | ICD-10-CM | POA: Diagnosis not present

## 2017-01-06 DIAGNOSIS — Z0000 Encounter for general adult medical examination without abnormal findings: Secondary | ICD-10-CM | POA: Diagnosis not present

## 2017-01-06 DIAGNOSIS — I10 Essential (primary) hypertension: Secondary | ICD-10-CM

## 2017-01-06 LAB — BASIC METABOLIC PANEL
BUN: 19 mg/dL (ref 6–23)
CALCIUM: 9.5 mg/dL (ref 8.4–10.5)
CO2: 30 mEq/L (ref 19–32)
Chloride: 101 mEq/L (ref 96–112)
Creatinine, Ser: 0.85 mg/dL (ref 0.40–1.20)
GFR: 68.22 mL/min (ref >60.00)
Glucose, Bld: 85 mg/dL (ref 70–99)
Potassium: 4.2 mEq/L (ref 3.5–5.1)
Sodium: 137 mEq/L (ref 135–145)

## 2017-01-06 LAB — CBC WITH DIFFERENTIAL/PLATELET
BASOS PCT: 1.4 % (ref 0.0–3.0)
Basophils Absolute: 0.1 10*3/uL (ref 0.0–0.1)
EOS ABS: 0.1 10*3/uL (ref 0.0–0.7)
EOS PCT: 1 % (ref 0.0–5.0)
HEMATOCRIT: 35.7 % — AB (ref 36.0–46.0)
HEMOGLOBIN: 12.2 g/dL (ref 12.0–15.0)
LYMPHS PCT: 23.1 % (ref 12.0–46.0)
Lymphs Abs: 1.2 10*3/uL (ref 0.7–4.0)
MCHC: 34.2 g/dL (ref 30.0–36.0)
MCV: 96.8 fl (ref 78.0–100.0)
Monocytes Absolute: 0.5 10*3/uL (ref 0.1–1.0)
Monocytes Relative: 9.8 % (ref 3.0–12.0)
Neutro Abs: 3.4 10*3/uL (ref 1.4–7.7)
Neutrophils Relative %: 64.7 % (ref 43.0–77.0)
Platelets: 197 10*3/uL (ref 150.0–400.0)
RBC: 3.69 Mil/uL — ABNORMAL LOW (ref 3.87–5.11)
RDW: 13.4 % (ref 11.5–15.5)
WBC: 5.3 10*3/uL (ref 4.0–10.5)

## 2017-01-06 LAB — TSH: TSH: 3.15 u[IU]/mL (ref 0.35–4.50)

## 2017-01-06 LAB — HEPATIC FUNCTION PANEL
ALT: 14 U/L (ref 0–35)
AST: 20 U/L (ref 0–37)
Albumin: 4.2 g/dL (ref 3.5–5.2)
Alkaline Phosphatase: 42 U/L (ref 39–117)
BILIRUBIN DIRECT: 0.1 mg/dL (ref 0.0–0.3)
BILIRUBIN TOTAL: 0.6 mg/dL (ref 0.2–1.2)
TOTAL PROTEIN: 8 g/dL (ref 6.0–8.3)

## 2017-01-06 LAB — LDL CHOLESTEROL, DIRECT: Direct LDL: 76 mg/dL

## 2017-01-06 LAB — LIPID PANEL
CHOL/HDL RATIO: 4
Cholesterol: 162 mg/dL (ref 0–200)
HDL: 45.4 mg/dL (ref >39.00)
NONHDL: 116.87
Triglycerides: 226 mg/dL — ABNORMAL HIGH (ref 0.0–149.0)
VLDL: 45.2 mg/dL — AB (ref 0.0–40.0)

## 2017-01-06 LAB — VITAMIN D 25 HYDROXY (VIT D DEFICIENCY, FRACTURES): VITD: 39.63 ng/mL (ref 30.00–100.00)

## 2017-01-06 NOTE — Progress Notes (Signed)
Pre visit review using our clinic review tool, if applicable. No additional management support is needed unless otherwise documented below in the visit note. 

## 2017-01-06 NOTE — Progress Notes (Signed)
   Subjective:    Patient ID: Breanna Yang, female    DOB: 1934/08/17, 83 y.o.   MRN: [id number]  HPI CPE- UTD on mammo, due for colonoscopy next year but no need due to age.  Pt is working outside regularly.  Exercising daily- walking.   Review of Systems Patient reports no vision/ hearing changes, adenopathy,fever, weight change,  persistant/recurrent hoarseness , swallowing issues, chest pain, palpitations, edema, persistant/recurrent cough, hemoptysis, dyspnea (rest/exertional/paroxysmal nocturnal), gastrointestinal bleeding (melena, rectal bleeding), abdominal pain, significant heartburn, bowel changes, GU symptoms (dysuria, hematuria, incontinence), Gyn symptoms (abnormal  bleeding, pain),  syncope, focal weakness, numbness & tingling, skin/hair/nail changes, abnormal bruising or bleeding, anxiety, or depression.   + memory issues    Objective:   Physical Exam General Appearance:    Alert, cooperative, no distress, appears stated age  Head:    Normocephalic, without obvious abnormality, atraumatic  Eyes:    PERRL, conjunctiva/corneas clear, EOM's intact, fundi    benign, both eyes  Ears:    Normal TM's and external ear canals, both ears  Nose:   Nares normal, septum midline, mucosa normal, no drainage    or sinus tenderness  Throat:   Lips, mucosa, and tongue normal; teeth and gums normal  Neck:   Supple, symmetrical, trachea midline, no adenopathy;    Thyroid: no enlargement/tenderness/nodules  Back:     Symmetric, no curvature, ROM normal, no CVA tenderness  Lungs:     Clear to auscultation bilaterally, respirations unlabored  Chest Wall:    No tenderness or deformity   Heart:    Regular rate and rhythm, S1 and S2 normal, no murmur, rub   or gallop  Breast Exam:    Deferred to mammo  Abdomen:     Soft, non-tender, bowel sounds active all four quadrants,    no masses, no organomegaly  Genitalia:    Deferred  Rectal:    Extremities:   Extremities normal, atraumatic, no  cyanosis or edema  Pulses:   2+ and symmetric all extremities  Skin:   Skin color, texture, turgor normal, no rashes or lesions  Lymph nodes:   Cervical, supraclavicular, and axillary nodes normal  Neurologic:   CNII-XII intact, normal strength, sensation and reflexes    throughout          Assessment & Plan:

## 2017-01-06 NOTE — Assessment & Plan Note (Signed)
Pt's PE WNL.  UTD on colonoscopy- no need for another.  UTD on mammo and DEXA.  UTD on immunizations.  Check labs.  Anticipatory guidance provided.

## 2017-01-06 NOTE — Assessment & Plan Note (Signed)
Chronic problem, tolerating statin w/o difficulty.  Check labs.  Adjust meds prn  

## 2017-01-06 NOTE — Assessment & Plan Note (Signed)
Chronic problem.  UTD on DEXA.  Check Vit D level and replete prn. 

## 2017-01-06 NOTE — Patient Instructions (Addendum)
Please schedule your Medicare Wellness visit w/ Maudie Mercury at your convenience Follow up with me in 6 months to recheck BP and cholesterol We'll notify you of your lab results and make any changes if needed You are up to date on your mammogram until November- yay!!! Keep up the good work on healthy diet and regular exercise- you look great!!! Call with any questions or concerns Happy Belated Birthday!!!

## 2017-01-06 NOTE — Assessment & Plan Note (Signed)
Well controlled today.  Asymptomatic.  Check labs.  No anticipated med changes.

## 2017-01-07 ENCOUNTER — Encounter: Payer: Self-pay | Admitting: General Practice

## 2017-01-07 ENCOUNTER — Ambulatory Visit: Payer: Medicare HMO | Admitting: Neurology

## 2017-01-13 ENCOUNTER — Telehealth: Payer: Self-pay

## 2017-01-13 NOTE — Telephone Encounter (Signed)
Clld pt's daughter, Deberah [facility] form for neurologic area has been completed and will be upfront ready for p/up. Dawn advsd to mail form.   Verified addr - made copy and put original forms in the mail to her.

## 2017-01-14 ENCOUNTER — Ambulatory Visit: Payer: Medicare HMO | Admitting: Neurology

## 2017-01-14 NOTE — Progress Notes (Signed)
Subjective:   Breanna Yang is a 83 y.o. female who presents for an Initial Medicare Annual Wellness Visit.  Review of Systems    No ROS.  Medicare Wellness Visit.  Cardiac Risk Factors include: advanced age (>28men, >36 women);dyslipidemia;hypertension   Sleep patterns: Sleeps 8 hours.  Home Safety/Smoke Alarms:  Smoke detectors and security in place.  Living environment; residence and Firearm Safety: Lives with daughter in 1 story home, 2 steps at door with rail. Feels safe at home. Safety alert button.  Seat Belt Safety/Bike Helmet: Wears seat belt.   Counseling:   Eye Exam-Last exam 01/2016, yearly by [facility]  Dental-Last exam > 2 years. Full dentures. Gum care discussed.   Female:   [id number]      Mammo-07/29/2016, Negative.       Dexa scan-07/29/2016, Osteopenia    [facility]-Colonoscopy 06/07/2008, Diverticulosis. Recall 5 years.       Objective:    Today's Vitals   01/15/17 0908  BP: 132/72  Pulse: 82  SpO2: 98%  Weight: 133 lb 3.2 oz (60.4 kg)  Height: 5\' 5"  (1.651 m)   Body mass index is 22.17 kg/m.   Current Medications (verified) Outpatient Encounter Prescriptions as of 01/15/2017  Medication Sig  . aspirin 81 MG tablet Take 81 mg by mouth daily.  . Cholecalciferol (VITAMIN D) 2000 units tablet Take 2,000 Units by mouth daily.  Marland Kitchen ENSURE (ENSURE) Take 237 mLs by mouth at bedtime.  . metoprolol succinate (TOPROL-XL) 50 MG 24 hr tablet Take 1 tablet (50 mg total) by mouth daily. Take with or immediately following a meal.  . Multiple Vitamin (MULTIVITAMIN) capsule Take 1 capsule by mouth daily.  . naproxen sodium (ANAPROX) 220 MG tablet Take 440 mg by mouth 2 (two) times daily with a meal.  . PARoxetine (PAXIL) 20 MG tablet Take 1 tablet (20 mg total) by mouth daily.  . simvastatin (ZOCOR) 40 MG tablet Take 1 tablet (40 mg total) by mouth daily at 6 PM.  . vitamin B-12 (CYANOCOBALAMIN) 500 MCG tablet Take 500 mcg by mouth daily.  . ranitidine (ZANTAC) 150 MG  tablet Take 1 tablet (150 mg total) by mouth 2 (two) times daily. (Patient not taking: Reported on 01/06/2017)   No facility-administered encounter medications on file as of 01/15/2017.     Allergies (verified) Patient has no known allergies.   History: Past Medical History:  Diagnosis Date  . Depression   . GERD (gastroesophageal reflux disease)   . Hyperlipidemia   . Hypertension   . Osteoarthritis   . Osteoporosis    Past Surgical History:  Procedure Laterality Date  . BRAIN SURGERY  1985   anneurysm   Family History  Problem Relation Age of Onset  . Cancer Sister    Social History   Occupational History  . Not on file.   Social History Main Topics  . Smoking status: Never Smoker  . Smokeless tobacco: Never Used  . Alcohol use No  . Drug use: No  . Sexual activity: Not on file    Tobacco Counseling Counseling given: No   Activities of Daily Living In your present state of health, do you have any difficulty performing the following activities: 01/15/2017 01/06/2017  Hearing? N N  Vision? N N  Difficulty concentrating or making decisions? N N  Walking or climbing stairs? N N  Dressing or bathing? N N  Doing errands, shopping? N N  Preparing Food and eating ? N -  Using the Toilet? N -  In the past six months, have you accidently leaked urine? N -  Do you have problems with loss of bowel control? N -  Managing your Medications? N -  Managing your Finances? N -  Housekeeping or managing your Housekeeping? N -  Some recent data might be hidden    Immunizations and Health Maintenance Immunization History  Administered Date(s) Administered  . Influenza,inj,Quad PF,36+ Mos 10/10/2016  . Pneumococcal Conjugate-13 09/07/2013  . Pneumococcal Polysaccharide-23 07/31/2009, 07/04/2016  . Tdap 06/29/2006  . Zoster 07/25/2008   There are no preventive care reminders to display for this patient.  Patient Care Team: Midge Minium, MD as PCP - General  (Family Medicine) Hurley Cisco, MD as Consulting Physician (Rheumatology) Druscilla Brownie, MD as Consulting Physician (Dermatology) Rutherford Guys, MD as Consulting Physician (Ophthalmology)  Indicate any recent Medical Services you may have received from other than [facility] providers in the past year (date may be approximate).     Assessment:   This is a routine wellness examination for [place]. Physical assessment deferred to PCP.   Hearing/Vision screen Hearing Screening Comments: Hearing loss in right ear. Denies issues on left side.  Vision Screening Comments: Wears glasses occasionally   Dietary issues and exercise activities discussed: Current Exercise Habits: Home exercise routine, Type of exercise: walking, Time (Minutes): 45, Frequency (Times/Week): 4, Weekly Exercise (Minutes/Week): 180, Exercise limited by: None identified   Diet (meal preparation, eat out, water intake, caffeinated beverages, dairy products, fruits and vegetables):  Drinks orange juice, ensure, chocolate milk, milkshakes  Breakfast: Eggs, sausage/bacon, grits, toast, jelly, coffee Lunch: stew, sandwich Dinner:  Vegetables, occasional protein, beans     Encouraged to continue diet and remain active.   Goals      Patient Stated   . patient (pt-stated)          Maintain current health by staying active.       Depression Screen PHQ 2/9 Scores 01/15/2017 01/06/2017 11/13/2016 07/04/2016  PHQ - 2 Score 0 0 0 1  PHQ- 9 Score - 0 0 -    Fall Risk Fall Risk  01/15/2017 01/06/2017 12/03/2016 07/04/2016  Falls in the past year? Yes No Yes No  Number falls in past yr: 1 - 1 -  Injury with Fall? No - No -  Risk for fall due to : - - Other (Comment) -  Follow up Falls prevention discussed - Falls evaluation completed;Education provided;Falls prevention discussed -    Cognitive Function: MMSE - Mini Mental State Exam 01/15/2017  Orientation to time 5  Orientation to Place 5  Registration 3  Attention/  Calculation 4  Recall 2  Language- name 2 objects 2  Language- repeat 1  Language- follow 3 step command 3  Language- read & follow direction 1  Write a sentence 1  Copy design 0  Total score 27   Montreal Cognitive Assessment  12/03/2016  Visuospatial/ Executive (0/5) 5  Naming (0/3) 2  Attention: Read list of digits (0/2) 2  Attention: Read list of letters (0/1) 1  Attention: Serial 7 subtraction starting at 100 (0/3) 2  Language: Repeat phrase (0/2) 2  Language : Fluency (0/1) 0  Abstraction (0/2) 1  Delayed Recall (0/5) 4  Orientation (0/6) 6  Total 25      Screening Tests Health Maintenance  Topic Date Due  . COLONOSCOPY  06/29/2017 (Originally 06/07/2013)  . TETANUS/TDAP  06/29/2017 (Originally 06/29/2016)  . INFLUENZA VACCINE  04/29/2017  . DEXA SCAN  Completed  . PNA  vac Low Risk Adult  Completed      Plan:     Continue doing brain stimulating activities (puzzles, reading, adult coloring books, staying active) to keep memory sharp.   Bring a copy of your advance directives to your next office visit.  During the course of the visit, Claris was educated and counseled about the following appropriate screening and preventive services:   Vaccines to include Pneumoccal, Influenza, Hepatitis B, Td, Zostavax, HCV  Cardiovascular disease screening  Colorectal cancer screening  Bone density screening  Diabetes screening  Glaucoma screening  Mammography/PAP  Nutrition counseling  Patient Instructions (the written plan) were given to the patient.    Gerilyn Nestle, RN   01/15/2017    Agree w/ documentation above.  Annye Asa, MD

## 2017-01-14 NOTE — Progress Notes (Signed)
Pre visit review using our clinic review tool, if applicable. No additional management support is needed unless otherwise documented below in the visit note. 

## 2017-01-15 ENCOUNTER — Ambulatory Visit (INDEPENDENT_AMBULATORY_CARE_PROVIDER_SITE_OTHER): Payer: Medicare HMO

## 2017-01-15 VITALS — BP 132/72 | HR 82 | Ht 65.0 in | Wt 133.2 lb

## 2017-01-15 DIAGNOSIS — Z0000 Encounter for general adult medical examination without abnormal findings: Secondary | ICD-10-CM

## 2017-01-15 NOTE — Patient Instructions (Addendum)
Continue doing brain stimulating activities (puzzles, reading, adult coloring books, staying active) to keep memory sharp.   Bring a copy of your advance directives to your next office visit.   Fall Prevention in the Home Falls can cause injuries. They can happen to people of all ages. There are many things you can do to make your home safe and to help prevent falls. What can I do on the outside of my home?  Regularly fix the edges of walkways and driveways and fix any cracks.  Remove anything that might make you trip as you walk through a door, such as a raised step or threshold.  Trim any bushes or trees on the path to your home.  Use bright outdoor lighting.  Clear any walking paths of anything that might make someone trip, such as rocks or tools.  Regularly check to see if handrails are loose or broken. Make sure that both sides of any steps have handrails.  Any raised decks and porches should have guardrails on the edges.  Have any leaves, snow, or ice cleared regularly.  Use sand or salt on walking paths during winter.  Clean up any spills in your garage right away. This includes oil or grease spills. What can I do in the bathroom?  Use night lights.  Install grab bars by the toilet and in the tub and shower. Do not use towel bars as grab bars.  Use non-skid mats or decals in the tub or shower.  If you need to sit down in the shower, use a plastic, non-slip stool.  Keep the floor dry. Clean up any water that spills on the floor as soon as it happens.  Remove soap buildup in the tub or shower regularly.  Attach bath mats securely with double-sided non-slip rug tape.  Do not have throw rugs and other things on the floor that can make you trip. What can I do in the bedroom?  Use night lights.  Make sure that you have a light by your bed that is easy to reach.  Do not use any sheets or blankets that are too big for your bed. They should not hang down onto the  floor.  Have a firm chair that has side arms. You can use this for support while you get dressed.  Do not have throw rugs and other things on the floor that can make you trip. What can I do in the kitchen?  Clean up any spills right away.  Avoid walking on wet floors.  Keep items that you use a lot in easy-to-reach places.  If you need to reach something above you, use a strong step stool that has a grab bar.  Keep electrical cords out of the way.  Do not use floor polish or wax that makes floors slippery. If you must use wax, use non-skid floor wax.  Do not have throw rugs and other things on the floor that can make you trip. What can I do with my stairs?  Do not leave any items on the stairs.  Make sure that there are handrails on both sides of the stairs and use them. Fix handrails that are broken or loose. Make sure that handrails are as long as the stairways.  Check any carpeting to make sure that it is firmly attached to the stairs. Fix any carpet that is loose or worn.  Avoid having throw rugs at the top or bottom of the stairs. If you do have throw rugs,  attach them to the floor with carpet tape.  Make sure that you have a light switch at the top of the stairs and the bottom of the stairs. If you do not have them, ask someone to add them for you. What else can I do to help prevent falls?  Wear shoes that:  Do not have high heels.  Have rubber bottoms.  Are comfortable and fit you well.  Are closed at the toe. Do not wear sandals.  If you use a stepladder:  Make sure that it is fully opened. Do not climb a closed stepladder.  Make sure that both sides of the stepladder are locked into place.  Ask someone to hold it for you, if possible.  Clearly mark and make sure that you can see:  Any grab bars or handrails.  First and last steps.  Where the edge of each step is.  Use tools that help you move around (mobility aids) if they are needed. These  include:  Canes.  Walkers.  Scooters.  Crutches.  Turn on the lights when you go into a dark area. Replace any light bulbs as soon as they burn out.  Set up your furniture so you have a clear path. Avoid moving your furniture around.  If any of your floors are uneven, fix them.  If there are any pets around you, be aware of where they are.  Review your medicines with your doctor. Some medicines can make you feel dizzy. This can increase your chance of falling. Ask your doctor what other things that you can do to help prevent falls. This information is not intended to replace advice given to you by your health care provider. Make sure you discuss any questions you have with your health care provider. Document Released: 07/12/2009 Document Revised: 02/21/2016 Document Reviewed: 10/20/2014 Elsevier Interactive Patient Education  2017 [place] Maintenance, Female Adopting a healthy lifestyle and getting preventive care can go a long way to promote health and wellness. Talk with your health care provider about what schedule of regular examinations is right for you. This is a good chance for you to check in with your provider about disease prevention and staying healthy. In between checkups, there are plenty of things you can do on your own. Experts have done a lot of research about which lifestyle changes and preventive measures are most likely to keep you healthy. Ask your health care provider for more information. Weight and diet Eat a healthy diet  Be sure to include plenty of vegetables, fruits, low-fat dairy products, and lean protein.  Do not eat a lot of foods high in solid fats, added sugars, or salt.  Get regular exercise. This is one of the most important things you can do for your health.  Most adults should exercise for at least 150 minutes each week. The exercise should increase your heart rate and make you sweat (moderate-intensity exercise).  Most adults  should also do strengthening exercises at least twice a week. This is in addition to the moderate-intensity exercise. Maintain a healthy weight  Body mass index (BMI) is a measurement that can be used to identify possible weight problems. It estimates body fat based on height and weight. Your health care provider can help determine your BMI and help you achieve or maintain a healthy weight.  For females 56 years of age and older:  A BMI below 18.5 is considered underweight.  A BMI of 18.5 to 24.9 is normal.  A BMI of 25 to 29.9 is considered overweight.  A BMI of 30 and above is considered obese. Watch levels of cholesterol and blood lipids  You should start having your blood tested for lipids and cholesterol at 81 years of age, then have this test every 5 years.  You may need to have your cholesterol levels checked more often if:  Your lipid or cholesterol levels are high.  You are older than 81 years of age.  You are at high risk for heart disease. Cancer screening Lung Cancer  Lung cancer screening is recommended for adults 26-55 years old who are at high risk for lung cancer because of a history of smoking.  A yearly low-dose CT scan of the lungs is recommended for people who:  Currently smoke.  Have quit within the past 15 years.  Have at least a 30-pack-year history of smoking. A pack year is smoking an average of one pack of cigarettes a day for 1 year.  Yearly screening should continue until it has been 15 years since you quit.  Yearly screening should stop if you develop a health problem that would prevent you from having lung cancer treatment. Breast Cancer  Practice breast self-awareness. This means understanding how your breasts normally appear and feel.  It also means doing regular breast self-exams. Let your health care provider know about any changes, no matter how small.  If you are in your 20s or 30s, you should have a clinical breast exam (CBE) by a  health care provider every 1-3 years as part of a regular health exam.  If you are 40 or older, have a CBE every year. Also consider having a breast X-ray (mammogram) every year.  If you have a family history of breast cancer, talk to your health care provider about genetic screening.  If you are at high risk for breast cancer, talk to your health care provider about having an MRI and a mammogram every year.  Breast cancer gene (BRCA) assessment is recommended for women who have family members with BRCA-related cancers. BRCA-related cancers include:  Breast.  Ovarian.  Tubal.  Peritoneal cancers.  Results of the assessment will determine the need for genetic counseling and BRCA1 and BRCA2 testing. Cervical Cancer  Your health care provider may recommend that you be screened regularly for cancer of the pelvic organs (ovaries, uterus, and vagina). This screening involves a pelvic examination, including checking for microscopic changes to the surface of your cervix (Pap test). You may be encouraged to have this screening done every 3 years, beginning at age 45.  For women ages 108-65, health care providers may recommend pelvic exams and Pap testing every 3 years, or they may recommend the Pap and pelvic exam, combined with testing for human papilloma virus (HPV), every 5 years. Some types of HPV increase your risk of cervical cancer. Testing for HPV may also be done on women of any age with unclear Pap test results.  Other health care providers may not recommend any screening for nonpregnant women who are considered low risk for pelvic cancer and who do not have symptoms. Ask your health care provider if a screening pelvic exam is right for you.  If you have had past treatment for cervical cancer or a condition that could lead to cancer, you need Pap tests and screening for cancer for at least 20 years after your treatment. If Pap tests have been discontinued, your risk factors (such as having  a new sexual partner)  need to be reassessed to determine if screening should resume. Some women have medical problems that increase the chance of getting cervical cancer. In these cases, your health care provider may recommend more frequent screening and Pap tests. Colorectal Cancer  This type of cancer can be detected and often prevented.  Routine colorectal cancer screening usually begins at 81 years of age and continues through 81 years of age.  Your health care provider may recommend screening at an earlier age if you have risk factors for colon cancer.  Your health care provider may also recommend using home test kits to check for hidden blood in the stool.  A small camera at the end of a tube can be used to examine your colon directly (sigmoidoscopy or colonoscopy). This is done to check for the earliest forms of colorectal cancer.  Routine screening usually begins at age 50.  Direct examination of the colon should be repeated every 5-10 years through 81 years of age. However, you may need to be screened more often if early forms of precancerous polyps or small growths are found. Skin Cancer  Check your skin from head to toe regularly.  Tell your health care provider about any new moles or changes in moles, especially if there is a change in a mole's shape or color.  Also tell your health care provider if you have a mole that is larger than the size of a pencil eraser.  Always use sunscreen. Apply sunscreen liberally and repeatedly throughout the day.  Protect yourself by wearing long sleeves, pants, a wide-brimmed hat, and sunglasses whenever you are outside. Heart disease, diabetes, and high blood pressure  High blood pressure causes heart disease and increases the risk of stroke. High blood pressure is more likely to develop in:  People who have blood pressure in the high end of the normal range (130-139/85-89 mm Hg).  People who are overweight or obese.  People who are  African American.  If you are 18-39 years of age, have your blood pressure checked every 3-5 years. If you are 40 years of age or older, have your blood pressure checked every year. You should have your blood pressure measured twice-once when you are at a hospital or clinic, and once when you are not at a hospital or clinic. Record the average of the two measurements. To check your blood pressure when you are not at a hospital or clinic, you can use:  An automated blood pressure machine at a pharmacy.  A home blood pressure monitor.  If you are between 55 years and 79 years old, ask your health care provider if you should take aspirin to prevent strokes.  Have regular diabetes screenings. This involves taking a blood sample to check your fasting blood sugar level.  If you are at a normal weight and have a low risk for diabetes, have this test once every three years after 81 years of age.  If you are overweight and have a high risk for diabetes, consider being tested at a younger age or more often. Preventing infection Hepatitis B  If you have a higher risk for hepatitis B, you should be screened for this virus. You are considered at high risk for hepatitis B if:  You were born in a country where hepatitis B is common. Ask your health care provider which countries are considered high risk.  Your parents were born in a high-risk country, and you have not been immunized against hepatitis B (hepatitis B vaccine).    You have HIV or AIDS.  You use needles to inject street drugs.  You live with someone who has hepatitis B.  You have had sex with someone who has hepatitis B.  You get hemodialysis treatment.  You take certain medicines for conditions, including cancer, organ transplantation, and autoimmune conditions. Hepatitis C  Blood testing is recommended for:  Everyone born from [birth year] through 1963.  Anyone with known risk factors for hepatitis C. Sexually transmitted infections  (STIs)  You should be screened for sexually transmitted infections (STIs) including gonorrhea and chlamydia if:  You are sexually active and are younger than 81 years of age.  You are older than 81 years of age and your health care provider tells you that you are at risk for this type of infection.  Your sexual activity has changed since you were last screened and you are at an increased risk for chlamydia or gonorrhea. Ask your health care provider if you are at risk.  If you do not have HIV, but are at risk, it may be recommended that you take a prescription medicine daily to prevent HIV infection. This is called pre-exposure prophylaxis (PrEP). You are considered at risk if:  You are sexually active and do not regularly use condoms or know the HIV status of your partner(s).  You take drugs by injection.  You are sexually active with a partner who has HIV. Talk with your health care provider about whether you are at high risk of being infected with HIV. If you choose to begin PrEP, you should first be tested for HIV. You should then be tested every 3 months for as long as you are taking PrEP. Pregnancy  If you are premenopausal and you may become pregnant, ask your health care provider about preconception counseling.  If you may become pregnant, take 400 to 800 micrograms (mcg) of folic acid every day.  If you want to prevent pregnancy, talk to your health care provider about birth control (contraception). Osteoporosis and menopause  Osteoporosis is a disease in which the bones lose minerals and strength with aging. This can result in serious bone fractures. Your risk for osteoporosis can be identified using a bone density scan.  If you are 20 years of age or older, or if you are at risk for osteoporosis and fractures, ask your health care provider if you should be screened.  Ask your health care provider whether you should take a calcium or vitamin D supplement to lower your risk  for osteoporosis.  Menopause may have certain physical symptoms and risks.  Hormone replacement therapy may reduce some of these symptoms and risks. Talk to your health care provider about whether hormone replacement therapy is right for you. Follow these instructions at home:  Schedule regular health, dental, and eye exams.  Stay current with your immunizations.  Do not use any tobacco products including cigarettes, chewing tobacco, or electronic cigarettes.  If you are pregnant, do not drink alcohol.  If you are breastfeeding, limit how much and how often you drink alcohol.  Limit alcohol intake to no more than 1 drink per day for nonpregnant women. One drink equals 12 ounces of beer, 5 ounces of wine, or 1 ounces of hard liquor.  Do not use street drugs.  Do not share needles.  Ask your health care provider for help if you need support or information about quitting drugs.  Tell your health care provider if you often feel depressed.  Tell your health care provider  if you have ever been abused or do not feel safe at home. This information is not intended to replace advice given to you by your health care provider. Make sure you discuss any questions you have with your health care provider. Document Released: 03/31/2011 Document Revised: 02/21/2016 Document Reviewed: 06/19/2015 Elsevier Interactive Patient Education  2017 [employer].

## 2017-02-23 ENCOUNTER — Other Ambulatory Visit: Payer: Self-pay | Admitting: Family Medicine

## 2017-04-06 ENCOUNTER — Ambulatory Visit (INDEPENDENT_AMBULATORY_CARE_PROVIDER_SITE_OTHER): Payer: Medicare HMO | Admitting: Neurology

## 2017-04-06 ENCOUNTER — Encounter: Payer: Self-pay | Admitting: Neurology

## 2017-04-06 VITALS — BP 122/64 | HR 74 | Ht 64.0 in | Wt 133.0 lb

## 2017-04-06 DIAGNOSIS — R55 Syncope and collapse: Secondary | ICD-10-CM | POA: Diagnosis not present

## 2017-04-06 DIAGNOSIS — Z87898 Personal history of other specified conditions: Secondary | ICD-10-CM | POA: Insufficient documentation

## 2017-04-06 DIAGNOSIS — G3184 Mild cognitive impairment, so stated: Secondary | ICD-10-CM | POA: Diagnosis not present

## 2017-04-06 DIAGNOSIS — R2681 Unsteadiness on feet: Secondary | ICD-10-CM

## 2017-04-06 NOTE — Patient Instructions (Signed)
1. Schedule Neurocognitive testing with Dr. Si Raider 2. Follow-up in 6 months, call for any changes  You have been referred for a neurocognitive evaluation in our office.   The evaluation takes approximately two hours. The first part of the appointment is a clinical interview with the neuropsychologist (Dr. Macarthur Critchley). Please bring someone with you to this appointment if possible, as it is helpful for Dr. Si Raider to hear from both you and another adult who knows you well. After speaking with Dr. Si Raider, you will complete testing with her technician. The testing includes a variety of tasks- mostly question-and-answer, some paper-and-pencil. There is nothing you need to do to prepare for this appointment, but having a good night's sleep prior to the testing, and bringing eyeglasses and hearing aids (if you wear them), is advised.   About a week after the evaluation, you will return to follow up with Dr. Si Raider to review the test results. This appointment is about 30 minutes. If you would like a family member to receive this information as well, please bring them to the appointment.   We have to reserve several hours of the neuropsychologist's time and the psychometrician's time for your evaluation appointment. As such, please note that there is a No-Show fee of $100. If you are unable to attend any of your appointments, please contact our office as soon as possible to reschedule.

## 2017-04-06 NOTE — Progress Notes (Signed)
NEUROLOGY FOLLOW UP OFFICE NOTE  Breanna Yang [id number] May 04, 1934  HISTORY OF PRESENT ILLNESS: I had the pleasure of seeing Breanna Yang in follow-up in the neurology clinic on 04/06/2017.  The patient was last seen 4 months ago for syncope, balance problems, and memory loss. She is again accompanied by her sister who helps supplement the history today.  Records and images were personally reviewed where available.  She was unable to do MRI brain due to radiology stating aneurysm clip is unsafe for MRI. Her wake and sleep EEG was normal. She denies any further syncopal episodes since February 2018. I had spoken to her daughter on the phone about memory complaints. The patient and her sister feel her memory is great. 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 She denies any difficulties with ADLs. Her sister does the driving for her. Her daughter is in charge of finances. She reports she will be moving in soon with her daughter. She was also reporting balance issues, and states she did good with physical therapy. No falls. 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   1. Syncope. She had a syncopal episode and was brought to the ER on 11/03/16. She states she had ran out of fuel and had no heat. Per ER notes, she was waving goodbye to family when she passed out with no prior warning symptoms. She denied any post-event confusion or focal weakness, no headache.She declined to go to the ER but went 2 days later due to unsteady gait and decreased appetite. She felt this was similar to when she was found to have an aneurysm in the past. She had reported a month history of  weakness as well and mild headache. Vital signs were stable, pulse was 58 bpm. Bloodwork unremarkable. 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 She was discharged home in stable condition and denies any further similar symptoms. She denies any staring/unresponsive episodes, gaps in time, olfactory/gustatory hallucinations, deja vu, rising epigastric sensation, focal numbness/tingling/weakness, myoclonic jerks. She denies any dizziness, palpitations, chest pain, shortness of breath. She has not been driving since then.  2. Balance problems: She and her sister do noticed some balance issues, she denies any falls but states she might wobble once in a while. She states she was told by her neurosurgeon that this may happen. She exercises and walks down her driveway. She denies any focal numbness/tingling/weakness, no neck/back pain, no bowel/bladder dysfunction.   3. Memory issues. She and her sister report her memory is good. She lives with her daughter who is in charge of bills. She denies any missed medications and states she writes them down. When she was driving, she denied getting lost. Her sister lives close by and sees her on a daily basis. She states her mood is great, her sister says "It's hateful," she is impatient and gets upset when her sister does not pick her up on time. Her sister denies any personality changes. Her  daughter sent a letter detailing her concerns. She has noticed the balance issues and states she has difficulties walking straight without staggering. With driving, she pulls out in front of traffic, often forgets directions, and is very nervous when she drives. She does not hear well, so her daughter is very  worried about her driving. Her memory has declined greatly over the last year she has been living with her. She gets details confused or if someone recalls things differently or challenges her thoughts, she gets very hostile and angry. She has noticed an increase in her anxiety as well and she always seems to be on edge to those close to her. In the past 6 months, she forgets who people are and cannot recall conversations or even simple details and daily tasks. Her daughter takes care of all her finances, but she has become increasingly concerned because her mother is targeted daily by scammers and she is extremely confused about her finances. She personally thinks she needs to be listed incompetent to handle her own affairs. I reviewed Dr. Virgil Yang note as well, the patient did not recall Dr. Birdie Yang on her last visit, she has seen her several times in the past year. When asked today about her PCP, she answers Dr. Birdie Yang is her PCP and she has seen her several times and loves her. 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 She has occasional headaches on the frontal or left temporal region since her brain surgery. She has horizontal diplopia without her glasses. No dizziness, dysarthria/dysphagia, neck/back pain, focal numbness/tingling/weakness, bowel/bladder dysfunction. No family history of dementia. Her father had a stroke. No history of concussions. She denies any alcohol use  PAST MEDICAL HISTORY: Past Medical History:  Diagnosis Date  . Depression   . GERD (gastroesophageal reflux disease)   . Hyperlipidemia   . Hypertension   . Osteoarthritis   . Osteoporosis     MEDICATIONS: Current Outpatient Prescriptions on File Prior to Visit  Medication Sig Dispense Refill  . aspirin 81 MG tablet Take 81 mg by mouth daily.    . Cholecalciferol (VITAMIN D) 2000 units tablet Take 2,000 Units by mouth daily.    Marland Kitchen ENSURE (ENSURE) Take  237 mLs by mouth at bedtime.    . metoprolol succinate (TOPROL-XL) 50 MG 24 hr tablet Take 1 tablet (50 mg total) by mouth daily. Take with or immediately following a meal. 30 tablet 6  . Multiple Vitamin (MULTIVITAMIN) capsule Take 1 capsule by mouth daily.    . naproxen sodium (ANAPROX) 220 MG tablet Take 440 mg by mouth 2 (two) times daily with a meal.    . PARoxetine (PAXIL) 20 MG tablet Take 1 tablet (20 mg total) by mouth daily. 30 tablet 6  . ranitidine (ZANTAC) 150 MG tablet Take 1 tablet (150 mg total) by mouth 2 (two) times daily. (Patient not taking: Reported on 01/06/2017) 60 tablet 0  . simvastatin (ZOCOR) 40 MG tablet TAKE 1 TABLET (40 MG TOTAL) BY MOUTH DAILY AT 6 PM. 30 tablet 6  . vitamin B-12 (CYANOCOBALAMIN) 500 MCG tablet Take 500 mcg by mouth daily.     No current facility-administered medications on file prior to visit.     ALLERGIES: No Known Allergies  FAMILY HISTORY: Family History  Problem Relation Age of Onset  . Cancer Sister     SOCIAL HISTORY: Social History   Social History  . Marital status:  Married    Spouse name: N/A  . Number of children: 2  . Years of education: some college   Occupational History  . Not on file.   Social History Main Topics  . Smoking status: Never Smoker  . Smokeless tobacco: Never Used  . Alcohol use No  . Drug use: No  . Sexual activity: Not on file   Other Topics Concern  . Not on file   Social History Narrative   Patient lives with daughter in a one story home.  Has 2 adopted children.  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  Education: some college.     REVIEW OF SYSTEMS: Constitutional: No fevers, chills, or sweats, no generalized fatigue, change in appetite Eyes: No visual changes, double vision, eye pain Ear, nose and throat: No hearing loss, ear pain, nasal congestion, sore throat Cardiovascular: No chest pain, palpitations Respiratory:  No shortness of breath at rest or with exertion,  wheezes GastrointestinaI: No nausea, vomiting, diarrhea, abdominal pain, fecal incontinence Genitourinary:  No dysuria, urinary retention or frequency Musculoskeletal:  No neck pain, back pain Integumentary: No rash, pruritus, skin lesions Neurological: as above Psychiatric: No depression, insomnia, anxiety Endocrine: No palpitations, fatigue, diaphoresis, mood swings, change in appetite, change in weight, increased thirst Hematologic/Lymphatic:  No anemia, purpura, petechiae. Allergic/Immunologic: no itchy/runny eyes, nasal congestion, recent allergic reactions, rashes  PHYSICAL EXAM: Vitals:   04/06/17 1029  BP: 122/64  Pulse: 74   General: No acute distress Head:  Normocephalic/atraumatic Neck: supple, no paraspinal tenderness, full range of motion Heart:  Regular rate and rhythm Lungs:  Clear to auscultation bilaterally Back: No paraspinal tenderness Skin/Extremities: No rash, no edema Neurological Exam: alert and oriented to person, place, and time. No aphasia or dysarthria. Fund of knowledge is appropriate.  Recent and remote memory are intact.  Attention and concentration are normal.    Able to name objects and repeat phrases. CDT 4/5  MMSE - Mini Mental State Exam 04/06/2017 01/15/2017  Orientation to time 5 5  Orientation to Place 5 5  Registration 3 3  Attention/ Calculation 4 4  Recall 1 2  Language- name 2 objects 2 2  Language- repeat 1 1  Language- follow 3 step command 3 3  Language- read & follow direction 1 1  Write a sentence 1 1  Copy design 1 0  Total score 27 27   Cranial nerves: Pupils equal, round, reactive to light.  Extraocular movements intact with no nystagmus. Visual fields full. Facial sensation intact. No facial asymmetry. Tongue, uvula, palate midline.  Motor: Bulk and tone normal, muscle strength 5/5 throughout with no pronator drift.  Sensation to light touch intact.  No extinction to double simultaneous stimulation.  Deep tendon reflexes +1  throughout except for absent ankle jerks bilaterally, toes downgoing.  Finger to nose testing intact.  Gait narrow-based with right foot everted (similar to prior), no ataxia. Romberg positive sway.  IMPRESSION: This is a pleasant 83 yo RH woman with a history of  hypertension, hyperlipidemia, brain aneurysm s/p clipping in the 1980s, who presented after a syncopal episode in February 2018. Head CT no acute changes, EEG normal. She denies any further syncopal episodes. She also reported balance issues, and feels better after PT, although still walks with right foot everted, no falls. She and her sister feel her memory is pretty good, hwoever her daughter expressed several concerns. MMSE today 27/30. We discussed doing Neurocognitive testing to further evaluate her symptoms. She will follow-up in 6  months and knows to call for any changes.   Thank you for allowing me to participate in her care.  Please do not hesitate to call for any questions or concerns.  The duration of this appointment visit was 25 minutes of face-to-face time with the patient.  Greater than 50% of this time was spent in counseling, explanation of diagnosis, planning of further management, and coordination of care.   Breanna Yang, M.D.   CC: Dr. Birdie Yang

## 2017-04-08 ENCOUNTER — Other Ambulatory Visit: Payer: Self-pay | Admitting: Family Medicine

## 2017-04-09 DIAGNOSIS — H5213 Myopia, bilateral: Secondary | ICD-10-CM | POA: Diagnosis not present

## 2017-04-09 DIAGNOSIS — H524 Presbyopia: Secondary | ICD-10-CM | POA: Diagnosis not present

## 2017-04-09 DIAGNOSIS — H52203 Unspecified astigmatism, bilateral: Secondary | ICD-10-CM | POA: Diagnosis not present

## 2017-04-21 ENCOUNTER — Other Ambulatory Visit: Payer: Self-pay | Admitting: Family Medicine

## 2017-04-23 DIAGNOSIS — L820 Inflamed seborrheic keratosis: Secondary | ICD-10-CM | POA: Diagnosis not present

## 2017-06-16 ENCOUNTER — Ambulatory Visit (INDEPENDENT_AMBULATORY_CARE_PROVIDER_SITE_OTHER): Payer: Medicare HMO

## 2017-06-16 DIAGNOSIS — Z23 Encounter for immunization: Secondary | ICD-10-CM | POA: Diagnosis not present

## 2017-06-23 ENCOUNTER — Other Ambulatory Visit: Payer: Self-pay | Admitting: Family Medicine

## 2017-06-23 DIAGNOSIS — Z1231 Encounter for screening mammogram for malignant neoplasm of breast: Secondary | ICD-10-CM

## 2017-06-29 ENCOUNTER — Encounter: Payer: Self-pay | Admitting: Psychology

## 2017-06-29 ENCOUNTER — Ambulatory Visit (INDEPENDENT_AMBULATORY_CARE_PROVIDER_SITE_OTHER): Payer: Medicare HMO | Admitting: Psychology

## 2017-06-29 DIAGNOSIS — R413 Other amnesia: Secondary | ICD-10-CM

## 2017-06-29 NOTE — Progress Notes (Signed)
NEUROPSYCHOLOGICAL INTERVIEW (CPT: [id number])  Name: Breanna Yang Date of Birth: 06-Feb-1934 Date of Interview: 06/29/2017  Reason for Referral:  Breanna Yang is a 83 y.o. right handed female who is referred for neuropsychological evaluation by Dr. Ellouise Newer of [facility] due to concerns about memory loss. This patient is accompanied in the office by her sister who supplements the history.  History of Presenting Problem:  Breanna Yang first saw Dr. Delice Lesch on 12/03/2016 for neurologic consultation for syncope, balance problems and memory issues. The patient denied any cognitive changes or memory concerns but her daughter sent in a letter detailing several concerns. She had an EEG which was normal. Her syncope/dizziness issues resolved. The patient followed up with Dr. Delice Lesch on 04/06/2017. MMSE was 27/30. There is no family history of dementia. 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   Head CT on 11/05/2016 reportedly revealed the following: Old posterior fossa aneurysm clipping on the right. Occipital craniectomy. 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 Right frontal encephalomalacia due to previous ventriculostomy. Atrophy and chronic small-vessel ischemic changes of the white matter, progressive since 2008. The ventricles are larger, but this is roughly in proportion to the progressive white matter disease.  She cannot have an MRI of the brain due to the clip she has.   The patient is accompanied to today's visit (06/29/2017) by her sister. The patient and her sister both state they have not noticed any significant cognitive decline. The patient endorses some mild, occasional forgetfulness but does not feel it is abnormal. She lives alone and manages her appointments, medications and cooking independently with no reported difficulty. She has several family members including her sister close by. She used to live with her daughter but her  daughter has moved to [place]. Her daughter still calls daily and visits frequently. 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 Her sister is doing most of the driving now, as the patient was advised to stop driving by her physicians. However, she and her sister deny any difficulties when she was driving. Her daughter is helping with management of the finances/bills but the patient manages her daily transactions and check writing. She reports she gets a lot of scamming/telemarketing calls but she has not been the victim of any fraud.   Physically, the patient reports she has been feeling great. When asked about balance problems, she stated she "can get a little wobbly sometimes". She does not have falls, maybe one in the past year. She denies any sleep difficulty. She gets 8+ hours of sleep a night and sometimes dozes off during the day. She eats well and her appetite is good.  Her sister denies any change in personality or mood. The patient reports her mood is good. She denies any significant anxiety. She had some normal grief related to the loss of her husband four years ago. She denies any psychiatric history and has never been treated by a mental health provider. She lost her adopted son to suicide about 20 years ago.   Social History: She was born and raised in [place] with 9 brothers and 2 sisters. She completed high school and some college courses. 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 She did not return to work. She and her husband adopted two children, a daughter and a son. Her son committed suicide  about 20 years ago. The patient's husband passed away 4 years ago. Breanna Yang does not drink any alcohol. She has never been a smoker or tobacco user.   Medical History: Past Medical History:  Diagnosis Date  . Depression   . GERD (gastroesophageal reflux disease)   . Hyperlipidemia   .  Hypertension   . Osteoarthritis   . Osteoporosis      Current Medications:  Outpatient Encounter Prescriptions as of 06/29/2017  Medication Sig  . aspirin 81 MG tablet Take 81 mg by mouth daily.  . Cholecalciferol (VITAMIN D) 2000 units tablet Take 2,000 Units by mouth daily.  Marland Kitchen ENSURE (ENSURE) Take 237 mLs by mouth at bedtime.  . metoprolol succinate (TOPROL-XL) 50 MG 24 hr tablet TAKE 1 TABLET (50 MG TOTAL) BY MOUTH DAILY. TAKE WITH OR IMMEDIATELY FOLLOWING A MEAL.  . Multiple Vitamin (MULTIVITAMIN) capsule Take 1 capsule by mouth daily.  . naproxen sodium (ANAPROX) 220 MG tablet Take 440 mg by mouth 2 (two) times daily with a meal.  . PARoxetine (PAXIL) 20 MG tablet TAKE 1 TABLET (20 MG TOTAL) BY MOUTH DAILY.  . ranitidine (ZANTAC) 150 MG tablet Take 1 tablet (150 mg total) by mouth 2 (two) times daily.  . simvastatin (ZOCOR) 40 MG tablet TAKE 1 TABLET (40 MG TOTAL) BY MOUTH DAILY AT 6 PM.  . vitamin B-12 (CYANOCOBALAMIN) 500 MCG tablet Take 500 mcg by mouth daily.   No facility-administered encounter medications on file as of 06/29/2017.      Behavioral Observations:   Appearance: Neatly and appropriately dressed and groomed, appearing younger than her chronological age Gait: Ambulated independently, no gross abnormalities observed Speech: Fluent; normal rate, rhythm and volume.  Thought process: Generally linear, at times off topic (providing responses out of context to question posed) Affect: Full, bright, euthymic Interpersonal: Very pleasant, appropriate   TESTING: There is medical necessity to proceed with neuropsychological assessment as the results will be used to aid in differential diagnosis and clinical decision-making and to inform specific treatment recommendations. Per the patient's daughter and medical records reviewed, there has been a change in cognitive functioning and a reasonable suspicion of dementia.  Following the clinical interview, the patient completed a  full battery of neuropsychological testing with my psychometrician under my supervision.   PLAN: The patient will return to see me for a follow-up session at which time her test performances and my impressions and treatment recommendations will be reviewed in detail.  Full report to follow.

## 2017-06-29 NOTE — Progress Notes (Signed)
   Neuropsychology Note  Breanna Yang came in today for 1 hour of neuropsychological testing with technician, Breanna Yang, BS, under the supervision of Dr. Macarthur Critchley. The patient did not appear overtly distressed by the testing session, per behavioral observation or via self-report to the technician. Rest breaks were offered. Breanna Yang will return within 2 weeks for a feedback session with Dr. Si Raider at which time her test performances, clinical impressions and treatment recommendations will be reviewed in detail. The patient understands she can contact our office should she require our assistance before this time.  Full report to follow.

## 2017-07-13 NOTE — Progress Notes (Signed)
NEUROPSYCHOLOGICAL EVALUATION   Name:    Breanna Yang  Date of Birth:   09/08/1934  Date of Interview:  06/29/2017 Date of Testing:  06/29/2017   Date of Feedback:  07/14/2017       Background Information:  Reason for Referral:  Breanna Yang is a 83 y.o. right handed female referred by Dr. Ellouise Newer to assess her current level of cognitive functioning and assist in differential diagnosis. The current evaluation consisted of a review of available medical records, an interview with the patient and her sister, and the completion of a neuropsychological testing battery. Informed consent was obtained.  History of Presenting Problem:  Ms. Zingale first saw Dr. Delice Lesch on 12/03/2016 for neurologic consultation for syncope, balance problems and memory issues. The patient denied any cognitive changes or memory concerns but her daughter sent in a letter detailing several concerns. She had an EEG which was normal. Her syncope/dizziness issues resolved. The patient followed up with Dr. Delice Lesch on 04/06/2017. MMSE was 27/30. There is no family history of dementia. 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 She cannot have an MRI of the brain due to the clip she has.   Head CT on 11/05/2016 reportedly revealed the following: Old posterior fossa aneurysm clipping on the right. Occipital craniectomy. 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 Right frontal encephalomalacia due to previous ventriculostomy. Atrophy and chronic small-vessel ischemic changes of the white matter, progressive since 2008. The ventricles are larger, but this is roughly in proportion to the progressive white matter disease.  The patient is accompanied to today's visit (06/29/2017) by her sister. The patient and her sister both state they have not noticed any significant cognitive decline. The patient endorses some mild, occasional forgetfulness but does not feel it is abnormal. She lives alone  and manages her appointments, medications and cooking independently with no reported difficulty. She has several family members including her sister close by. She used to live with her daughter but her daughter has moved to [place]. Her daughter still calls daily and visits frequently. 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 Her sister is doing most of the driving now, as the patient was advised to stop driving by her physicians. However, she and her sister deny any difficulties when she was driving. Her daughter is helping with management of the finances/bills but the patient manages her daily transactions and check writing. She reports she gets a lot of scamming/telemarketing calls but she states she has not been the victim of any fraud or exploitation.   Physically, the patient reports she has been feeling great. When asked about balance problems, she stated she "can get a little wobbly sometimes". She does not have falls, maybe one in the past year. She denies any sleep difficulty. She gets 8+ hours of sleep a night and sometimes dozes off during the day. She eats well and her appetite is good.  Her sister denies any change in personality or mood. The patient reports her mood is good. She denies any significant anxiety. She had some normal grief related to the loss of her husband four years ago. She denies any psychiatric history and has never been treated by a mental health provider. She lost her adopted son to suicide about 20 years ago.   Social History: She was born and raised in [place] with 9 brothers and 2 sisters. She completed high  school and some college courses. 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 She did not return to work. She and her husband adopted two children, a daughter and a son. Her son committed suicide about 20 years ago. The patient's husband passed away 4 years ago.  Mrs. Moralez does not drink any alcohol. She has never been a smoker or tobacco user.   Medical History:  Past Medical History:  Diagnosis Date  . Depression   . GERD (gastroesophageal reflux disease)   . Hyperlipidemia   . Hypertension   . Osteoarthritis   . Osteoporosis     Current medications:  Outpatient Encounter Prescriptions as of 07/14/2017  Medication Sig  . aspirin 81 MG tablet Take 81 mg by mouth daily.  . Cholecalciferol (VITAMIN D) 2000 units tablet Take 2,000 Units by mouth daily.  Marland Kitchen ENSURE (ENSURE) Take 237 mLs by mouth at bedtime.  . metoprolol succinate (TOPROL-XL) 50 MG 24 hr tablet TAKE 1 TABLET (50 MG TOTAL) BY MOUTH DAILY. TAKE WITH OR IMMEDIATELY FOLLOWING A MEAL.  . Multiple Vitamin (MULTIVITAMIN) capsule Take 1 capsule by mouth daily.  . naproxen sodium (ANAPROX) 220 MG tablet Take 440 mg by mouth 2 (two) times daily with a meal.  . PARoxetine (PAXIL) 20 MG tablet TAKE 1 TABLET (20 MG TOTAL) BY MOUTH DAILY.  . ranitidine (ZANTAC) 150 MG tablet Take 1 tablet (150 mg total) by mouth 2 (two) times daily.  . simvastatin (ZOCOR) 40 MG tablet TAKE 1 TABLET (40 MG TOTAL) BY MOUTH DAILY AT 6 PM.  . vitamin B-12 (CYANOCOBALAMIN) 500 MCG tablet Take 500 mcg by mouth daily.   No facility-administered encounter medications on file as of 07/14/2017.      Current Examination:  Behavioral Observations:  Appearance: Neatly and appropriately dressed and groomed, appearing younger than her chronological age Gait: Ambulated independently, no gross abnormalities observed Speech: Fluent; normal rate, rhythm and volume.  Thought process: Generally linear, at times off topic (providing responses out of context to question posed) Affect: Full, bright, euthymic Interpersonal: Very pleasant, appropriate Orientation: Oriented to all spheres. Accurately named the current President and his predecessor.   Tests Administered: . Test of Premorbid Functioning (TOPF) . Wechsler  Adult Intelligence Scale-Fourth Edition (WAIS-IV): Similarities, Music therapist, Coding and Digit Span subtests . Wechsler Memory Scale-Fourth Edition (WMS-IV) Older Adult Version (ages 27-90): Logical Memory I, II and Recognition subtests  . [employer] Verbal Learning Test - 2nd Edition (CVLT-2) Short Form . Repeatable Battery for the Assessment of Neuropsychological Status (RBANS) Form A:  Figure Copy and Recall subtests and Semantic Fluency subtest . Neuropsychological Assessment Battery (NAB) Language Module, Form 1: Naming subtest . Boston Diagnostic Aphasia Examination: Complex Ideational Material subtest . Controlled Oral Word Association Test (COWAT) . Trail Making Test A and B . Clock drawing test . Geriatric Depression Scale (GDS) 15 Item . Generalized Anxiety Disorder - 7 item screener (GAD-7)  Test Results: Note: Standardized scores are presented only for use by appropriately trained professionals and to allow for any future test-retest comparison. These scores should not be interpreted without consideration of all the information that is contained in the rest of the report. The most recent standardization samples from the test publisher or other sources were used whenever possible to derive standard scores; scores were corrected for age, gender, ethnicity and education when available.   Test Scores:  Test Name Raw Score Standardized Score Descriptor  TOPF 15/70  SS= 79 Borderline  WAIS-IV Subtests     Similarities 2/36 ss= 1 Severely Impaired  Block Design 29/66 ss= 11 Average  Coding 24/135 ss= 7 Low average  Digit Span Forward 7/16 ss= 7 Low average  Digit Span Backward 6/16 ss= 8 Low end of average  WMS-IV Subtests     LM I 9/53 ss= 4 Impaired  LM II 2/39 ss= 5 Borderline  LM II Recognition 13/23 Cum %: 3-9 Impaired  RBANS Subtests     Figure Copy 18/20 Z= 0.4 Average  Figure Recall 5/20 Z= -1.6 Borderline  Semantic Fluency 14/40 Z= -0.9 Low average  CVLT-II Scores       Trial 1 4/9 Z= -1 Low average  Trial 4 7/9 Z= -0.5 Average  Trials 1-4 total 23/36 T= 45 Average  SD Free Recall 4/9 Z= -1.5 Borderline  LD Free Recall 5/9 Z= -0.5 Average  LD Cued Recall 5/9 Z= -1 Low average  Recognition Discriminability 7/9 hits, 2 false positives Z= -0.5 Average  Forced Choice Recognition 9/9  WNL  NAB Language subtest     Naming 13/31 T= 19 Severely impaired  BDAE Subtest     Complex Ideational Material 7/12  Impaired  COWAT-FAS 15 T= 30 Impaired  COWAT-Animals 11 T= 38 Low average  Trail Making Test A  75" 0 errors T= 45 Average  Trail Making Test B  Pt unable   Severely impaired  Clock Drawing   Impaired  GDS-15 0/15  WNL  GAD-7 0/21  WNL      Description of Test Results:  Premorbid verbal intellectual abilities were estimated to have been within the borderline range based on a test of word reading. This is unexpected given her educational background and may be an underestimate. Psychomotor processing speed was low average. Auditory attention and working memory were low average. Visual-spatial construction was average. Language abilities were variable. Specifically, confrontation naming was severely impaired, and semantic verbal fluency was low average. Auditory comprehension of complex ideational material was impaired. With regard to verbal memory, encoding and acquisition of non-contextual information (i.e., word list) was average. After a brief distracter task, free recall was borderline impaired (4/9 items recalled). After a delay, free recall was average (5/9 items recalled). She did not benefit from semantic cueing (low average). Performance on a yes/no recognition task was average. On another verbal memory test, encoding and acquisition of contextual auditory information (i.e., short stories) was impaired. After a delay, free recall was impaired. Performance on a yes/no recognition task was impaired. With regard to non-verbal memory, delayed free recall  of visual information was borderline impaired. Executive functioning was impaired overall. Mental flexibility and set-shifting were severely impaired; she was unable to complete Trails [place]. Verbal fluency with phonemic search restrictions was impaired. Verbal abstract reasoning was severely impaired. Performance on a clock drawing task was impaired. On a self-report measure of mood, the patient's responses were not indicative of clinically significant depression or anxiety at the present time.    Clinical Impressions: Mild dementia, likely vascular dementia. Results of cognitive testing revealed significant dysfunction in multiple domains. Additionally, there is evidence that cognitive impairment is interfering with her ability to manage complex tasks such as driving and managing finances. As such, diagnostic criteria for a dementia syndrome are met. The stage of dementia, based on test results and current level of functioning, appears to be mild stage. Fortunately there is no evidence of depression, anxiety or psychiatric or behavioral disturbance. The patient's cognitive profile is  reflective of predominantly frontal-subcortical dysfunction. Specifically, there is prominent executive dysfunction and problems with memory encoding/retrieval, while visual-spatial construction and memory consolidation are relatively spared. This is a cognitive profile commonly seen in vascular dementia due to small vessel disease. CT of the head performed earlier this year did demonstrate chronic small-vessel ischemic changes of the white matter, progressive since 2008.      Recommendations/Plan: Based on the findings of the present evaluation, the following recommendations are offered:  1. Increased assistance with complex tasks is recommended. Her daughter is already assisting with management of finances, which I agree with. The patient is at increased risk of financial exploitation and undue influence given her  significant cognitive dysfunction and especially severely impaired reasoning. I also recommend closer monitoring of her medications to ensure that she is taking them properly. Memory and executive function deficits could interfere with her ability to do so. Finally, for her safety and the safety of others on the road, I recommend an on road driving evaluation if she wishes to return to driving. She performed poorly on a cognitive test highly correlated with driving ability, and her executive dysfunction increases the risk of an accident. She was amenable to completing an evaluation with the DMV and I will submit the necessary form requesting re-evaluation to the [facility]. 2. The patient is considered an appropriate candidate for cholinesterase inhibitor therapy from a neuropsychological perspective. She can follow up with Dr. Delice Lesch about this. She is not sure she wants to be on another medication. 3. The patient's family will benefit from education and support surrounding her diagnosis and dementia caregiving. They are referred to the Alzheimer's Association ([place]) which provides information for individuals with dementia of various etiologies, including vascular dementia.   Feedback to Patient: ADRIENE KNIPFER returned for a feedback appointment on 07/14/2017 to review the results of her neuropsychological evaluation with this provider. 20 minutes face-to-face time was spent reviewing her test results, my impressions and my recommendations as detailed above.    Total time spent on this patient's case: [id number] unit for interview with psychologist; [phone] units of testing by psychometrician under psychologist's supervision; [phone] units for medical record review, scoring of neuropsychological tests, interpretation of test results, preparation of this report, and review of results to the patient by psychologist.      Thank you for your referral of [place]. Please feel free to contact me if  you have any questions or concerns regarding this report.

## 2017-07-14 ENCOUNTER — Encounter: Payer: Self-pay | Admitting: Psychology

## 2017-07-14 ENCOUNTER — Ambulatory Visit (INDEPENDENT_AMBULATORY_CARE_PROVIDER_SITE_OTHER): Payer: Medicare HMO | Admitting: Psychology

## 2017-07-14 DIAGNOSIS — F0150 Vascular dementia without behavioral disturbance: Secondary | ICD-10-CM | POA: Diagnosis not present

## 2017-07-14 DIAGNOSIS — R69 Illness, unspecified: Secondary | ICD-10-CM | POA: Diagnosis not present

## 2017-07-30 ENCOUNTER — Ambulatory Visit
Admission: RE | Admit: 2017-07-30 | Discharge: 2017-07-30 | Disposition: A | Discharge status: Home / Self Care | Payer: Medicare HMO | Source: Ambulatory Visit | Attending: Family Medicine | Admitting: Family Medicine

## 2017-07-30 DIAGNOSIS — Z1231 Encounter for screening mammogram for malignant neoplasm of breast: Secondary | ICD-10-CM | POA: Diagnosis not present

## 2017-07-31 ENCOUNTER — Encounter: Payer: Self-pay | Admitting: General Practice

## 2017-08-03 ENCOUNTER — Telehealth: Payer: Self-pay | Admitting: Family Medicine

## 2017-08-03 NOTE — Telephone Encounter (Signed)
Pt dropped off DMV forms, pt asked that we fax when complete and also give a copy to her, place in bin upfront with charge sheet.

## 2017-08-04 NOTE — Telephone Encounter (Signed)
Forms need to be completed by Neurology or NeuroPsychology as they recommended in their notes a driving re-evaluation.  I am not the treating physician for her issue (dementia)

## 2017-08-14 ENCOUNTER — Telehealth: Payer: Self-pay | Admitting: *Deleted

## 2017-08-14 NOTE — Telephone Encounter (Signed)
I spoke with patient's daughter.  I let her know that the paperwork we had from the [facility] was supposed to be filled out by Neurology.   I gave the paperwork back to patient with the name and phone number of who was supposed to get it.  Daughter stated that she will call their office to find out if they were dropped off.  She appreciated the call back to fill her in.   Copied from [place] [id number]. Topic: Inquiry >> Aug 14, 2017  2:59 PM Oliver Pila B wrote: Reason for CRM: pt's [facility] called and is trying to get info about the [facility] papers that her mom supposedly dropped off, she is trying to get informed of the situation and may have some questions in order that she can do the right thing concerning her mothers vehicle and future care

## 2017-08-17 ENCOUNTER — Telehealth: Payer: Self-pay | Admitting: Neurology

## 2017-08-17 NOTE — Telephone Encounter (Signed)
Patient had dropped off some paperwork that she will need back or faxed in within 30 days. She was calling in to check the status. Please Advise. Thanks

## 2017-08-24 NOTE — Telephone Encounter (Signed)
Patient wants to know the status of the paper work for the [facility] she dropped off please call her today

## 2017-08-26 NOTE — Telephone Encounter (Signed)
Spoke with pt letting her know that paperwork was filled out and faxed to [facility] yesterday.

## 2017-09-09 ENCOUNTER — Other Ambulatory Visit: Payer: Self-pay | Admitting: General Practice

## 2017-09-09 MED ORDER — SIMVASTATIN 40 MG PO TABS: 40.0000 mg | ORAL_TABLET | Freq: Every day | ORAL | 1 refills | Status: DC

## 2017-09-25 ENCOUNTER — Telehealth: Payer: Self-pay | Admitting: Family Medicine

## 2017-09-25 ENCOUNTER — Telehealth: Payer: Self-pay | Admitting: Neurology
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
## 2017-09-25 NOTE — Telephone Encounter (Signed)
Daughter states that mom spent some time with her over Christmas, and she noticed a lot of change in mom's memory.  She states that the [facility] had received forms from the Neurologist office and she was supposed to go take a driving test.  She has not done this, she refuses to stop driving.   Daughter states that she cannot keep any details straight, and family was really having to repeat things numerous times about every hour.   Patient thinks that our office is plotting against her (even though the paperwork was not done here) -daughter is very concerned with her memory and she has talked with pt's sister who denies that it is this bad and the sister is covering up for her.   Daughter is also contacting the Neurology office as well.... She is aware of the follow-up with Neurology and is going to try to go to that appointment with her mom.  Routing to PCP just so she is aware.

## 2017-09-25 NOTE — Telephone Encounter (Signed)
Patient's daughter [name]) called and would like speak with you before her mom's appointment next week. Please Call. Thanks

## 2017-09-25 NOTE — Telephone Encounter (Signed)
I agree there are memory issues and that pt should not be driving.  I also agree that follow up w/ Neurology is the best next step

## 2017-09-25 NOTE — Telephone Encounter (Signed)
Copied from [place] [phone] [phone]. Topic: Quick Communication - See Telephone Encounter >> Sep 25, 2017  2:18 PM Burnis Medin, NT wrote: CRM for notification. See Telephone encounter for: Daughter called in because she says she is concerned about her mother's memory loss and the things she have noticed. Daughter would like to speak to the nurse and would like a call back.   09/25/17.

## 2017-09-28 NOTE — Telephone Encounter (Signed)
Noted, thanks!

## 2017-10-07 ENCOUNTER — Ambulatory Visit: Payer: Medicare HMO | Admitting: Neurology

## 2017-10-07 ENCOUNTER — Encounter: Payer: Self-pay | Admitting: Neurology

## 2017-10-07 VITALS — BP 148/74 | HR 77 | Ht 64.5 in | Wt 137.0 lb

## 2017-10-07 DIAGNOSIS — R69 Illness, unspecified: Secondary | ICD-10-CM | POA: Diagnosis not present

## 2017-10-07 DIAGNOSIS — F0150 Vascular dementia without behavioral disturbance: Secondary | ICD-10-CM

## 2017-10-07 MED ORDER — DONEPEZIL HCL 10 MG PO TABS: ORAL_TABLET | ORAL | 11 refills | Status: DC

## 2017-10-07 NOTE — Patient Instructions (Signed)
1. Start Aricept 10mg  daily 2. Continue all your other medications. Have your daughter help with making sure we are taking medications regularly 3. Proceed with driving test with the DMV 4. Follow-up in 6 months, call for any changes  FALL PRECAUTIONS: Be cautious when walking. Scan the area for obstacles that may increase the risk of trips and falls. When getting up in the mornings, sit up at the edge of the bed for a few minutes before getting out of bed. Consider elevating the bed at the head end to avoid drop of blood pressure when getting up. Walk always in a well-lit room (use night lights in the walls). Avoid area rugs or power cords from appliances in the middle of the walkways. Use a walker or a cane if necessary and consider physical therapy for balance exercise. Get your eyesight checked regularly.  FINANCIAL OVERSIGHT: Supervision, especially oversight when making financial decisions or transactions is also recommended.  HOME SAFETY: Consider the safety of the kitchen when operating appliances like stoves, microwave oven, and blender. Consider having supervision and share cooking responsibilities until no longer able to participate in those. Accidents with firearms and other hazards in the house should be identified and addressed as well.  DRIVING: Regarding driving, in patients with progressive memory problems, driving will be impaired. We advise to have someone else do the driving if trouble finding directions or if minor accidents are reported. Independent driving assessment is available to determine safety of driving.  ABILITY TO BE LEFT ALONE: If patient is unable to contact 911 operator, consider using LifeLine, or when the need is there, arrange for someone to stay with patients. Smoking is a fire hazard, consider supervision or cessation. Risk of wandering should be assessed by caregiver and if detected at any point, supervision and safe proof recommendations should be  instituted.  MEDICATION SUPERVISION: Inability to self-administer medication needs to be constantly addressed. Implement a mechanism to ensure safe administration of the medications.  RECOMMENDATIONS FOR ALL PATIENTS WITH MEMORY PROBLEMS: 1. Continue to exercise (Recommend 30 minutes of walking everyday, or 3 hours every week) 2. Increase social interactions - continue going to [place] and enjoy social gatherings with friends and family 3. Eat healthy, avoid fried foods and eat more fruits and vegetables 4. Maintain adequate blood pressure, blood sugar, and blood cholesterol level. Reducing the risk of stroke and cardiovascular disease also helps promoting better memory. 5. Avoid stressful situations. Live a simple life and avoid aggravations. Organize your time and prepare for the next day in anticipation. 6. Sleep well, avoid any interruptions of sleep and avoid any distractions in the bedroom that may interfere with adequate sleep quality 7. Avoid sugar, avoid sweets as there is a strong link between excessive sugar intake, diabetes, and cognitive impairment The Mediterranean diet has been shown to help patients reduce the risk of progressive memory disorders and reduces cardiovascular risk. This includes eating fish, eat fruits and green leafy vegetables, nuts like almonds and hazelnuts, walnuts, and also use olive oil. Avoid fast foods and fried foods as much as possible. Avoid sweets and sugar as sugar use has been linked to worsening of memory function.  There is always a concern of gradual progression of memory problems. If this is the case, then we may need to adjust level of care according to patient needs. Support, both to the patient and caregiver, should then be put into place.

## 2017-10-07 NOTE — Progress Notes (Signed)
NEUROLOGY FOLLOW UP OFFICE NOTE  Breanna Yang [id number] November 26, 1933  HISTORY OF PRESENT ILLNESS: I had the pleasure of seeing Breanna Yang in follow-up in the neurology clinic on 10/07/2017.  The patient was last seen 6 months ago for syncope, balance problems, and memory loss. She is accompanied by her daughter who helps supplement the history today.  Records and images were personally reviewed where available.  Since her last visit, she underwent Neuropsychological testing in 06/2017 which indicated mild dementia, likely vascular dementia. There was significant dysfunction in multiple domains, as well as evidence that cognitive impairment is interfering with her ability to manage complex tasks such as driving and managing finances. The stage of dementia, based on test results and current level of functioning, appears to be mild stage. Her cognitive profile is reflective of predominantly frontal-subcortical dysfunction which is commonly seen in vascular dementia due to small vessel disease.   Her daughter had called our office prior to the visit stating she believes that her mother is not being honest with her doctors. She reported her mother was still driving and that nothing will make her stop. The patient stayed with her over the holiday and her daughter was shocked at the patient's decline. She was becoming more and more irate, she has thrown things at people and smacked things out of people's hands. Per phone note, the daughter thinks patient is "trying to get one over her doctors."   She reports that she is not driving. She went to the [facility] to have her driving test and was told she would be contacted, but has not heard back. She continues to live alone, her daughter Breanna Yang calls her multiple times a day. She states it is very seldom that she ever forgets to do something. She has a checklist when she takes her medications. She takes Aleve about every morning for headaches. She feels off balance  when in the yard, but denies any falls. She denies any further syncopal episodes since February 2018.  HPI 12/03/2016: This is an 84 yo RH woman with a history of hypertension, hyperlipidemia, brain aneurysm s/p clipping in the 1980s, presenting for evaluation of syncope, balance problems, and memory loss.   1. Syncope. She had a syncopal episode and was brought to the ER on 11/03/16. She states she had ran out of fuel and had no heat. Per ER notes, she was waving goodbye to family when she passed out with no prior warning symptoms. She denied any post-event confusion or focal weakness, no headache.She declined to go to the ER but went 2 days later due to unsteady gait and decreased appetite. She felt this was similar to when she was found to have an aneurysm in the past. She had reported a month history of weakness as well and mild headache. Vital signs were stable, pulse was 58 bpm. Bloodwork unremarkable. 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 She was discharged home in stable condition and denies any further similar symptoms. She denies any staring/unresponsive episodes, gaps in time, olfactory/gustatory hallucinations, deja vu, rising epigastric sensation, focal numbness/tingling/weakness, myoclonic jerks. She denies any dizziness, palpitations, chest pain, shortness of breath. She has not been driving since then.  2. Balance problems: She and her sister do noticed some balance issues, she denies any  falls but states she might wobble once in a while. She states she was told by her neurosurgeon that this may happen. She exercises and walks down her driveway. She denies any focal numbness/tingling/weakness, no neck/back pain, no  bowel/bladder dysfunction.   3. Memory issues. She and her sister report her memory is good. She lives with her daughter who is in charge of bills. She denies any missed medications and states she writes them down. When she was driving, she denied getting lost. Her sister lives close by and sees her on a daily basis. She states her mood is great, her sister says "It's hateful," she is impatient and gets upset when her sister does not pick her up on time. Her sister denies any personality changes. Her daughter sent a letter detailing her concerns. She has noticed the balance issues and states she has difficulties walking straight without staggering. With driving, she pulls out in front of traffic, often forgets directions, and is very nervous when she drives. She does not hear well, so her daughter is very worried about her driving. Her memory has declined greatly over the last year she has been living with her. She gets details confused or if someone recalls things differently or challenges her thoughts, she gets very hostile and angry. She has noticed an increase in her anxiety as well and she always seems to be on edge to those close to her. In the past 6 months, she forgets who people are and cannot recall conversations or even simple details and daily tasks. Her daughter takes care of all her finances, but she has become increasingly concerned because her mother is targeted daily by scammers and she is extremely confused about her finances. She personally thinks she needs to be listed incompetent to handle her own affairs. I reviewed [name] note as well, the patient did not recall [name] on her last visit, she has seen her several times in the past year. When asked today about her PCP, she answers [name] is her PCP and she has seen her several times and loves her. 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 She has occasional  headaches on the frontal or left temporal region since her brain surgery. She has horizontal diplopia without her glasses. No dizziness, dysarthria/dysphagia, neck/back pain, focal numbness/tingling/weakness, bowel/bladder dysfunction. No family history of dementia. Her father had a stroke. No history of concussions. She denies any alcohol use  PAST MEDICAL HISTORY: Past Medical History:  Diagnosis Date  . Depression   . GERD (gastroesophageal reflux disease)   . Hyperlipidemia   . Hypertension   . Osteoarthritis   . Osteoporosis     MEDICATIONS: Current Outpatient Medications on File Prior to Visit  Medication Sig Dispense Refill  . aspirin 81 MG tablet Take 81 mg by mouth daily.    . Cholecalciferol (VITAMIN D) 2000 units tablet Take 2,000 Units by mouth daily.    Marland Kitchen ENSURE (ENSURE) Take 237 mLs by mouth at bedtime.    . metoprolol succinate (TOPROL-XL) 50 MG 24 hr tablet TAKE 1 TABLET (50 MG TOTAL) BY MOUTH DAILY. TAKE WITH OR IMMEDIATELY FOLLOWING A MEAL. 30 tablet 6  . Multiple Vitamin (MULTIVITAMIN) capsule Take 1 capsule by mouth daily.    . naproxen sodium (ANAPROX) 220 MG tablet Take 440 mg by mouth 2 (two) times daily with a meal.    . PARoxetine (PAXIL) 20  MG tablet TAKE 1 TABLET (20 MG TOTAL) BY MOUTH DAILY. 30 tablet 6  . ranitidine (ZANTAC) 150 MG tablet Take 1 tablet (150 mg total) by mouth 2 (two) times daily. 60 tablet 0  . simvastatin (ZOCOR) 40 MG tablet Take 1 tablet (40 mg total) by mouth daily at 6 PM. 90 tablet 1  . vitamin B-12 (CYANOCOBALAMIN) 500 MCG tablet Take 500 mcg by mouth daily.     No current facility-administered medications on file prior to visit.     ALLERGIES: No Known Allergies  FAMILY HISTORY: Family History  Problem Relation Age of Onset  . Cancer Sister     SOCIAL HISTORY: Social History   Socioeconomic History  . Marital status: Married    Spouse name: Not on file  . Number of children: 2  . Years of education: some college  .  Highest education level: Not on file  Social Needs  . Financial resource strain: Not on file  . Food insecurity - worry: Not on file  . Food insecurity - inability: Not on file  . Transportation needs - medical: Not on file  . Transportation needs - non-medical: Not on file  Occupational History  . Not on file  Tobacco Use  . Smoking status: Never Smoker  . Smokeless tobacco: Never Used  Substance and Sexual Activity  . Alcohol use: No  . Drug use: No  . Sexual activity: Not on file  Other Topics Concern  . Not on file  Social History Narrative   Patient lives with daughter in a one story home.  Has 2 adopted children.  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  Education: some college.     REVIEW OF SYSTEMS: Constitutional: No fevers, chills, or sweats, no generalized fatigue, change in appetite Eyes: No visual changes, double vision, eye pain Ear, nose and throat: No hearing loss, ear pain, nasal congestion, sore throat Cardiovascular: No chest pain, palpitations Respiratory:  No shortness of breath at rest or with exertion, wheezes GastrointestinaI: No nausea, vomiting, diarrhea, abdominal pain, fecal incontinence Genitourinary:  No dysuria, urinary retention or frequency Musculoskeletal:  No neck pain, back pain Integumentary: No rash, pruritus, skin lesions Neurological: as above Psychiatric: No depression, insomnia, anxiety Endocrine: No palpitations, fatigue, diaphoresis, mood swings, change in appetite, change in weight, increased thirst Hematologic/Lymphatic:  No anemia, purpura, petechiae. Allergic/Immunologic: no itchy/runny eyes, nasal congestion, recent allergic reactions, rashes  PHYSICAL EXAM: Vitals:   10/07/17 1516  BP: (!) 148/74  Pulse: 77  SpO2: 97%   General: No acute distress Head:  Normocephalic/atraumatic Neck: supple, no paraspinal tenderness, full range of motion Heart:  Regular rate and rhythm Lungs:  Clear to auscultation bilaterally Back: No  paraspinal tenderness Skin/Extremities: No rash, no edema Neurological Exam: alert and oriented to person, place, and time. No aphasia or dysarthria. Fund of knowledge is appropriate.  Recent and remote memory are intact.  Attention and concentration are normal.    Able to name objects and repeat phrases. Cranial nerves: Pupils equal, round. No facial asymmetry. Motor: moves all extremities symmetrically. Gait narrow-based with right foot everted (similar to prior), no ataxia.   IMPRESSION: This is a pleasant 84 yo RH woman with a history of  hypertension, hyperlipidemia, brain aneurysm s/p clipping in the 1980s, who presented after a syncopal episode in February 2018. Head CT no acute changes, EEG normal. She denies any further syncopal episodes. She underwent Neuropsychological testing which indicated mild dementia, likely vascular dementia. Findings and recommendations from the  testing were discussed at length with the patient and her daughter today, she is agreeable to start Aricept 10mg  daily, side effects were discussed. We discussed recommendation to do a driving evaluation, a letter will be sent to the [facility] regarding this recommendation. We also discussed having her daughter monitor her medications. We discussed the importance of control of vascular risk factors, physical exercise, and brain stimulation exercises for brain health. She will follow-up in 6 months and knows to call for any changes.   Thank you for allowing me to participate in her care.  Please do not hesitate to call for any questions or concerns.  The duration of this appointment visit was 15 minutes of face-to-face time with the patient.  Greater than 50% of this time was spent in counseling, explanation of diagnosis, planning of further management, and coordination of care.   Ellouise Newer, M.D.   CC: [name]

## 2017-10-13 ENCOUNTER — Encounter: Payer: Self-pay | Admitting: Neurology

## 2017-10-13 DIAGNOSIS — F0150 Vascular dementia without behavioral disturbance: Secondary | ICD-10-CM | POA: Insufficient documentation

## 2017-10-30 ENCOUNTER — Telehealth: Payer: Self-pay

## 2017-10-30 NOTE — Telephone Encounter (Signed)
Called daughter, [name].  No answer.  LMOM asking for return call for message below.

## 2017-10-30 NOTE — Telephone Encounter (Signed)
-----   Message from Cameron Sprang, MD sent at 10/30/2017 12:33 PM EST ----- Regarding: daughter Pls see Dana's email and ask what daughter needs. Let daughter know to communicate with [place] via MyChart or calling our office. Is she asking if Aricept was called in? Because I think it was. For the letter, pls let her know we can write that she needs assistance managing her finances, but we cannot write a letter of incompetence, there is a separate competency evaluation to say this.   Thanks!

## 2017-11-16 ENCOUNTER — Other Ambulatory Visit: Payer: Self-pay | Admitting: Family Medicine

## 2017-11-18 DIAGNOSIS — L821 Other seborrheic keratosis: Secondary | ICD-10-CM | POA: Diagnosis not present

## 2018-01-06 DIAGNOSIS — Z0279 Encounter for issue of other medical certificate: Secondary | ICD-10-CM

## 2018-01-08 ENCOUNTER — Other Ambulatory Visit: Payer: Self-pay | Admitting: General Practice

## 2018-01-08 MED ORDER — PAROXETINE HCL 20 MG PO TABS: 20.0000 mg | ORAL_TABLET | Freq: Every day | ORAL | 1 refills | Status: DC

## 2018-01-12 ENCOUNTER — Other Ambulatory Visit: Payer: Self-pay | Admitting: General Practice

## 2018-01-12 MED ORDER — METOPROLOL SUCCINATE ER 50 MG PO TB24: 50.0000 mg | ORAL_TABLET | Freq: Every day | ORAL | 0 refills | Status: DC

## 2018-02-10 ENCOUNTER — Encounter: Payer: Self-pay | Admitting: Family Medicine

## 2018-02-10 ENCOUNTER — Ambulatory Visit (INDEPENDENT_AMBULATORY_CARE_PROVIDER_SITE_OTHER): Payer: Medicare HMO | Admitting: Family Medicine

## 2018-02-10 ENCOUNTER — Other Ambulatory Visit: Payer: Self-pay

## 2018-02-10 VITALS — BP 140/72 | HR 73 | Temp 98.0°F | Resp 16 | Ht 65.0 in | Wt 135.4 lb

## 2018-02-10 DIAGNOSIS — M810 Age-related osteoporosis without current pathological fracture: Secondary | ICD-10-CM

## 2018-02-10 DIAGNOSIS — E785 Hyperlipidemia, unspecified: Secondary | ICD-10-CM

## 2018-02-10 DIAGNOSIS — R69 Illness, unspecified: Secondary | ICD-10-CM | POA: Diagnosis not present

## 2018-02-10 DIAGNOSIS — F0150 Vascular dementia without behavioral disturbance: Secondary | ICD-10-CM | POA: Diagnosis not present

## 2018-02-10 DIAGNOSIS — Z0000 Encounter for general adult medical examination without abnormal findings: Secondary | ICD-10-CM | POA: Diagnosis not present

## 2018-02-10 DIAGNOSIS — Z86010 Personal history of colonic polyps: Secondary | ICD-10-CM | POA: Diagnosis not present

## 2018-02-10 NOTE — Assessment & Plan Note (Signed)
Ongoing issue.  Check Vit D and replete prn. 

## 2018-02-10 NOTE — Assessment & Plan Note (Signed)
Ongoing issue for pt.  She is having obvious memory deficits today- did not know her age, confused by her medications, did not remember having neuropsych testing.  Forms completed for DMV indicating that I do not feel she is fit to drive.

## 2018-02-10 NOTE — Assessment & Plan Note (Signed)
Chronic problem.  Tolerating statin w/o difficulty.  Check labs.  Adjust meds prn  

## 2018-02-10 NOTE — Patient Instructions (Addendum)
Follow up in 6 months to recheck BP and cholesterol We'll notify you of your lab results and make any changes if needed Keep up the good work!  You look great! I will let you know what the GI doctors say about the colonoscopy We submitted the forms to the [facility] for you Call with any questions or concerns Have a great summer!!!

## 2018-02-10 NOTE — Progress Notes (Signed)
   Subjective:    Patient ID: Breanna Yang, female    DOB: 1933-12-13, 84 y.o.   MRN: [id number]  HPI CPE- UTD on mammo, no longer having paps.  Isn't sure about a repeat colonoscopy.  UTD on immunizations.  No concerns today.  Pt brought a DMV form for completion due to her vascular dementia.  Pt is very confused about her current medications.  Got her current age wrong   Review of Systems Patient reports no vision/ hearing changes, adenopathy,fever, weight change,  persistant/recurrent hoarseness , swallowing issues, chest pain, palpitations, edema, persistant/recurrent cough, hemoptysis, dyspnea (rest/exertional/paroxysmal nocturnal), gastrointestinal bleeding (melena, rectal bleeding), abdominal pain, significant heartburn, bowel changes, GU symptoms (dysuria, hematuria, incontinence), Gyn symptoms (abnormal  bleeding, pain),  syncope, focal weakness, numbness & tingling, skin/hair/nail changes, abnormal bruising or bleeding, anxiety, or depression.     Objective:   Physical Exam General Appearance:    Alert, cooperative, no distress, appears stated age  Head:    Normocephalic, without obvious abnormality, atraumatic  Eyes:    PERRL, conjunctiva/corneas clear, EOM's intact, fundi    benign, both eyes  Ears:    Normal TM's and external ear canals, both ears  Nose:   Nares normal, septum midline, mucosa normal, no drainage    or sinus tenderness  Throat:   Lips, mucosa, and tongue normal; teeth and gums normal  Neck:   Supple, symmetrical, trachea midline, no adenopathy;    Thyroid: no enlargement/tenderness/nodules  Back:     Symmetric, no curvature, ROM normal, no CVA tenderness  Lungs:     Clear to auscultation bilaterally, respirations unlabored  Chest Wall:    No tenderness or deformity   Heart:    Regular rate and rhythm, S1 and S2 normal, no murmur, rub   or gallop  Breast Exam:    Deferred to mammo  Abdomen:     Soft, non-tender, bowel sounds active all four quadrants,    no  masses, no organomegaly  Genitalia:    Deferred  Rectal:    Extremities:   Extremities normal, atraumatic, no cyanosis or edema  Pulses:   2+ and symmetric all extremities  Skin:   Skin color, texture, turgor normal, no rashes or lesions  Lymph nodes:   Cervical, supraclavicular, and axillary nodes normal  Neurologic:   CNII-XII intact, normal strength, sensation and reflexes    throughout          Assessment & Plan:

## 2018-02-10 NOTE — Assessment & Plan Note (Signed)
Pt's PE unchanged from previous.  UTD on mammo.  Unclear if she is due for another colonoscopy (was due in 2014) given age and medical co-morbidities.  UTD on immunizations.  Check labs.  Anticipatory guidance provided.

## 2018-02-11 ENCOUNTER — Encounter: Payer: Self-pay | Admitting: Gastroenterology

## 2018-02-11 ENCOUNTER — Other Ambulatory Visit: Payer: Self-pay | Admitting: General Practice

## 2018-02-11 LAB — HEPATIC FUNCTION PANEL
AG Ratio: 1.1 (calc) (ref 1.0–2.5)
ALBUMIN MSPROF: 4.3 g/dL (ref 3.6–5.1)
ALT: 20 U/L (ref 6–29)
AST: 26 U/L (ref 10–35)
Alkaline phosphatase (APISO): 49 U/L (ref 33–130)
BILIRUBIN DIRECT: 0.1 mg/dL (ref 0.0–0.2)
GLOBULIN: 4 g/dL — AB (ref 1.9–3.7)
Indirect Bilirubin: 0.4 mg/dL (calc) (ref 0.2–1.2)
TOTAL PROTEIN: 8.3 g/dL — AB (ref 6.1–8.1)
Total Bilirubin: 0.5 mg/dL (ref 0.2–1.2)

## 2018-02-11 LAB — BASIC METABOLIC PANEL
BUN: 15 mg/dL (ref 7–25)
CALCIUM: 9.4 mg/dL (ref 8.6–10.4)
CO2: 25 mmol/L (ref 20–32)
CREATININE: 0.82 mg/dL (ref 0.60–0.88)
Chloride: 104 mmol/L (ref 98–110)
GLUCOSE: 86 mg/dL (ref 65–99)
POTASSIUM: 3.8 mmol/L (ref 3.5–5.3)
Sodium: 140 mmol/L (ref 135–146)

## 2018-02-11 LAB — CBC WITH DIFFERENTIAL/PLATELET
BASOS PCT: 0.2 %
Basophils Absolute: 11 cells/uL (ref 0–200)
EOS PCT: 0.9 %
Eosinophils Absolute: 48 cells/uL (ref 15–500)
HEMATOCRIT: 33.6 % — AB (ref 35.0–45.0)
Hemoglobin: 11.6 g/dL — ABNORMAL LOW (ref 11.7–15.5)
LYMPHS ABS: 1447 {cells}/uL (ref 850–3900)
MCH: 32.5 pg (ref 27.0–33.0)
MCHC: 34.5 g/dL (ref 32.0–36.0)
MCV: 94.1 fL (ref 80.0–100.0)
MPV: 10.9 fL (ref 7.5–12.5)
Monocytes Relative: 12.3 %
NEUTROS PCT: 59.3 %
Neutro Abs: 3143 cells/uL (ref 1500–7800)
PLATELETS: 183 10*3/uL (ref 140–400)
RBC: 3.57 10*6/uL — AB (ref 3.80–5.10)
RDW: 12.4 % (ref 11.0–15.0)
TOTAL LYMPHOCYTE: 27.3 %
WBC: 5.3 10*3/uL (ref 3.8–10.8)
WBCMIX: 652 {cells}/uL (ref 200–950)

## 2018-02-11 LAB — TSH: TSH: 1.37 m[IU]/L (ref 0.40–4.50)

## 2018-02-11 LAB — LIPID PANEL
CHOLESTEROL: 144 mg/dL (ref <200)
HDL: 41 mg/dL — ABNORMAL LOW (ref >50)
LDL CHOLESTEROL (CALC): 64 mg/dL
NON-HDL CHOLESTEROL (CALC): 103 mg/dL (ref <130)
Total CHOL/HDL Ratio: 3.5 (calc) (ref <5.0)
Triglycerides: 322 mg/dL — ABNORMAL HIGH (ref <150)

## 2018-02-11 LAB — VITAMIN D 25 HYDROXY (VIT D DEFICIENCY, FRACTURES): Vit D, 25-Hydroxy: 25 ng/mL — ABNORMAL LOW (ref 30–100)

## 2018-02-11 MED ORDER — VITAMIN D (ERGOCALCIFEROL) 1.25 MG (50000 UNIT) PO CAPS: 50000.0000 [IU] | ORAL_CAPSULE | ORAL | 0 refills | Status: DC

## 2018-04-03 ENCOUNTER — Other Ambulatory Visit: Payer: Self-pay | Admitting: Family Medicine

## 2018-04-08 ENCOUNTER — Other Ambulatory Visit: Payer: Self-pay | Admitting: Family Medicine

## 2018-04-08 ENCOUNTER — Ambulatory Visit: Payer: Medicare HMO | Admitting: Neurology

## 2018-04-09 ENCOUNTER — Encounter: Payer: Self-pay | Admitting: Gastroenterology

## 2018-04-09 ENCOUNTER — Ambulatory Visit: Payer: Medicare HMO | Admitting: Gastroenterology

## 2018-04-09 ENCOUNTER — Encounter (INDEPENDENT_AMBULATORY_CARE_PROVIDER_SITE_OTHER): Payer: Self-pay

## 2018-04-09 VITALS — BP 102/60 | HR 68 | Ht 65.0 in | Wt 136.1 lb

## 2018-04-09 DIAGNOSIS — Z86010 Personal history of colonic polyps: Secondary | ICD-10-CM

## 2018-04-09 NOTE — Patient Instructions (Signed)
If you are age 82 or older, your body mass index should be between 23-30. Your Body mass index is 22.65 kg/m. If this is out of the aforementioned range listed, please consider follow up with your Primary Care Provider.  If you are age 60 or younger, your body mass index should be between 19-25. Your Body mass index is 22.65 kg/m. If this is out of the aformentioned range listed, please consider follow up with your Primary Care Provider.   It was a pleasure to meet you today!  Dr. Loletha Carrow

## 2018-04-09 NOTE — Progress Notes (Signed)
[facility] Consult Note:  History: Breanna Yang 04/09/2018  Referring physician: Midge Minium, MD  Reason for consult/chief complaint: Discuss colon and Diarrhea (Has about once a month)   Subjective  HPI:  This is a very pleasant 84 year old woman referred by primary care for history of colon polyp.  Breanna Yang had a subcentimeter tubular adenoma removed during a colonoscopy by Dr. Sharlett Iles in September 2009.  She did not schedule a follow-up colonoscopy when recalled in 2014. I reviewed her recent primary care note, and it sounds like she has some memory issues, but is highly functional.  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  She denies abdominal pain, altered bowel habits, rectal bleeding, dysphagia, odynophagia, nausea, vomiting, early satiety or weight loss.  ROS:  Review of Systems She denies chest pain or dyspnea Past Medical History: Past Medical History:  Diagnosis Date  . Depression   . GERD (gastroesophageal reflux disease)   . Hyperlipidemia   . Hypertension   . Osteoarthritis   . Osteoporosis      Past Surgical History: Past Surgical History:  Procedure Laterality Date  . BRAIN SURGERY  1985   anneurysm     Family History: Family History  Problem Relation Age of Onset  . Cancer Sister     Social History: Social History   Socioeconomic History  . Marital status: Married    Spouse name: Not on file  . Number of children: 3  . Years of education: some college  . Highest education level: Not on file  Occupational History  . Not on file  Social Needs  . Financial resource strain: Not on file  . Food insecurity:    Worry: Not on file    Inability: Not on file  . Transportation needs:    Medical: Not on file    Non-medical: Not on file  Tobacco Use  . Smoking status: Never Smoker  . Smokeless tobacco: Never Used  Substance and Sexual  Activity  . Alcohol use: No  . Drug use: No  . Sexual activity: Not on file  Lifestyle  . Physical activity:    Days per week: Not on file    Minutes per session: Not on file  . Stress: Not on file  Relationships  . Social connections:    Talks on phone: Not on file    Gets together: Not on file    Attends religious service: Not on file    Active member of club or organization: Not on file    Attends meetings of clubs or organizations: Not on file    Relationship status: Not on file  Other Topics Concern  . Not on file  Social History Narrative   Patient lives with daughter in a one story home.  Has 2 adopted children.  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  Education: some college.     Allergies: No Known Allergies  Outpatient Meds: Current Outpatient Medications  Medication Sig Dispense Refill  . aspirin 81 MG tablet Take 81 mg by mouth daily.    . Cholecalciferol (VITAMIN D) 2000 units tablet Take 2,000 Units by mouth daily.    Marland Kitchen donepezil (ARICEPT) 10 MG tablet Take 1 tablet daily 30 tablet 11  . ENSURE (ENSURE) Take 237 mLs by mouth at bedtime.    . metoprolol succinate (TOPROL-XL) 50 MG 24 hr tablet TAKE 1 TABLET (50 MG TOTAL) BY  MOUTH DAILY. TAKE WITH OR IMMEDIATELY FOLLOWING A MEAL. 90 tablet 0  . Multiple Vitamin (MULTIVITAMIN) capsule Take 1 capsule by mouth daily.    . simvastatin (ZOCOR) 40 MG tablet TAKE 1 TABLET BY MOUTH EVERY EVENING AT 6PM 90 tablet 1  . vitamin B-12 (CYANOCOBALAMIN) 500 MCG tablet Take 500 mcg by mouth daily.    . Vitamin D, Ergocalciferol, (DRISDOL) 50000 units CAPS capsule Take 1 capsule (50,000 Units total) by mouth every 7 (seven) days. 12 capsule 0   No current facility-administered medications for this visit.       ___________________________________________________________________ Objective   Exam:  BP 102/60   Pulse 68   Ht 5\' 5"  (1.651 m)   Wt 136 lb 2 oz (61.7 kg)   BMI 22.65 kg/m    General: this is a(n)  well-appearing woman  Eyes: sclera anicteric, no redness  ENT: oral mucosa moist without lesions, no cervical or supraclavicular lymphadenopathy, good dentition  CV: RRR without murmur, S1/S2, no JVD, no peripheral edema  Resp: clear to auscultation bilaterally, normal RR and effort noted  GI: soft, no tenderness, with active bowel sounds. No guarding or palpable organomegaly noted.  Skin; warm and dry, no rash or jaundice noted  Neuro: awake, alert and oriented x 3. Normal gross motor function and fluent speech  Labs:  CBC Latest Ref Rng & Units 02/10/2018 01/06/2017 11/05/2016  WBC 3.8 - 10.8 Thousand/uL 5.3 5.3 5.3  Hemoglobin 11.7 - 15.5 g/dL 11.6(L) 12.2 10.8(L)  Hematocrit 35.0 - 45.0 % 33.6(L) 35.7(L) 32.1(L)  Platelets 140 - 400 Thousand/uL 183 197.0 164   Normal MCV  Assessment: Encounter Diagnosis  Name Primary?  . Personal history of colonic polyps Yes   Despite her overall good health, she is past surveillance age, and I think the risks outweigh the expected benefits of a surveillance colonoscopy.  She is happy to hear that, and will contact me if she develops any concerns.   Thank you for the courtesy of this consult.  Please call me with any questions or concerns.  Nelida Meuse III  CC: Midge Minium, MD

## 2018-04-12 DIAGNOSIS — H5213 Myopia, bilateral: Secondary | ICD-10-CM | POA: Diagnosis not present

## 2018-04-12 DIAGNOSIS — H52203 Unspecified astigmatism, bilateral: Secondary | ICD-10-CM | POA: Diagnosis not present

## 2018-04-12 DIAGNOSIS — H524 Presbyopia: Secondary | ICD-10-CM | POA: Diagnosis not present

## 2018-05-03 DIAGNOSIS — B079 Viral wart, unspecified: Secondary | ICD-10-CM | POA: Diagnosis not present

## 2018-05-03 DIAGNOSIS — D485 Neoplasm of uncertain behavior of skin: Secondary | ICD-10-CM | POA: Diagnosis not present

## 2018-05-03 DIAGNOSIS — L570 Actinic keratosis: Secondary | ICD-10-CM | POA: Diagnosis not present

## 2018-05-03 DIAGNOSIS — L821 Other seborrheic keratosis: Secondary | ICD-10-CM | POA: Diagnosis not present

## 2018-05-03 DIAGNOSIS — L814 Other melanin hyperpigmentation: Secondary | ICD-10-CM | POA: Diagnosis not present

## 2018-05-04 ENCOUNTER — Other Ambulatory Visit: Payer: Self-pay | Admitting: Family Medicine

## 2018-05-25 DIAGNOSIS — R69 Illness, unspecified: Secondary | ICD-10-CM | POA: Diagnosis not present

## 2018-05-25 DIAGNOSIS — Z823 Family history of stroke: Secondary | ICD-10-CM | POA: Diagnosis not present

## 2018-05-25 DIAGNOSIS — I10 Essential (primary) hypertension: Secondary | ICD-10-CM | POA: Diagnosis not present

## 2018-05-25 DIAGNOSIS — E785 Hyperlipidemia, unspecified: Secondary | ICD-10-CM | POA: Diagnosis not present

## 2018-05-25 DIAGNOSIS — Z809 Family history of malignant neoplasm, unspecified: Secondary | ICD-10-CM | POA: Diagnosis not present

## 2018-05-30 ENCOUNTER — Other Ambulatory Visit: Payer: Self-pay | Admitting: Family Medicine

## 2018-06-07 ENCOUNTER — Ambulatory Visit: Payer: Medicare HMO | Admitting: Podiatry

## 2018-06-07 ENCOUNTER — Encounter: Payer: Self-pay | Admitting: Podiatry

## 2018-06-07 VITALS — BP 127/66 | HR 64

## 2018-06-07 DIAGNOSIS — L84 Corns and callosities: Secondary | ICD-10-CM | POA: Diagnosis not present

## 2018-06-07 DIAGNOSIS — M2041 Other hammer toe(s) (acquired), right foot: Secondary | ICD-10-CM

## 2018-06-07 DIAGNOSIS — M2042 Other hammer toe(s) (acquired), left foot: Secondary | ICD-10-CM

## 2018-06-08 NOTE — Progress Notes (Signed)
Subjective:   Patient ID: Breanna Yang, female   DOB: 84 y.o.   MRN: [id number]   HPI Patient presents with painful lesion between the big toe second toe right over left with deviation of the big toe against the second toe bilateral.  States is been there for a while and is worsened recently and patient does not smoke and likes to be active   Review of Systems  All other systems reviewed and are negative.       Objective:  Physical Exam  Constitutional: She appears well-developed and well-nourished.  Cardiovascular: Intact distal pulses.  Pulmonary/Chest: Effort normal.  Musculoskeletal: Normal range of motion.  Neurological: She is alert.  Skin: Skin is warm.  Nursing note and vitals reviewed.   Neurovascular status was found to be intact muscle strength is adequate range of motion within normal limits with patient found to have deviation of the hallux right over left with keratotic lesion between the hallux and second digit with pain with palpation.  Patient is noted to have good digital perfusion and is well oriented x3     Assessment:  Hallux interphalangeus deformity right over left with keratotic lesion between the hallux second toe that are painful when palpated right over left     Plan:  H&P condition reviewed and discussed debridement technique with padding with possibility for eventual osteotomy arthroplasty procedure.  Educated her on this and today debridement accomplished with no iatrogenic bleeding and she will be seen back for routine visit or whenever symptoms should become prevalent

## 2018-06-15 DIAGNOSIS — R69 Illness, unspecified: Secondary | ICD-10-CM | POA: Diagnosis not present

## 2018-07-06 ENCOUNTER — Other Ambulatory Visit: Payer: Self-pay | Admitting: Family Medicine

## 2018-07-09 ENCOUNTER — Other Ambulatory Visit: Payer: Self-pay | Admitting: Family Medicine

## 2018-07-09 DIAGNOSIS — Z1231 Encounter for screening mammogram for malignant neoplasm of breast: Secondary | ICD-10-CM

## 2018-08-11 ENCOUNTER — Ambulatory Visit: Payer: Medicare HMO | Admitting: Family Medicine

## 2018-08-17 ENCOUNTER — Ambulatory Visit (INDEPENDENT_AMBULATORY_CARE_PROVIDER_SITE_OTHER): Payer: Medicare HMO | Admitting: Family Medicine

## 2018-08-17 ENCOUNTER — Other Ambulatory Visit: Payer: Self-pay

## 2018-08-17 ENCOUNTER — Encounter: Payer: Self-pay | Admitting: Family Medicine

## 2018-08-17 VITALS — BP 132/80 | HR 60 | Temp 97.0°F | Resp 16 | Ht 65.0 in | Wt 131.2 lb

## 2018-08-17 DIAGNOSIS — F0150 Vascular dementia without behavioral disturbance: Secondary | ICD-10-CM | POA: Diagnosis not present

## 2018-08-17 DIAGNOSIS — E785 Hyperlipidemia, unspecified: Secondary | ICD-10-CM | POA: Diagnosis not present

## 2018-08-17 DIAGNOSIS — R69 Illness, unspecified: Secondary | ICD-10-CM | POA: Diagnosis not present

## 2018-08-17 DIAGNOSIS — I10 Essential (primary) hypertension: Secondary | ICD-10-CM

## 2018-08-17 LAB — BASIC METABOLIC PANEL
BUN: 18 mg/dL (ref 6–23)
CHLORIDE: 103 meq/L (ref 96–112)
CO2: 28 mEq/L (ref 19–32)
CREATININE: 0.9 mg/dL (ref 0.40–1.20)
Calcium: 9.7 mg/dL (ref 8.4–10.5)
GFR: 63.61 mL/min (ref >60.00)
Glucose, Bld: 93 mg/dL (ref 70–99)
POTASSIUM: 3.9 meq/L (ref 3.5–5.1)
Sodium: 138 mEq/L (ref 135–145)

## 2018-08-17 LAB — CBC WITH DIFFERENTIAL/PLATELET
BASOS PCT: 0.5 % (ref 0.0–3.0)
Basophils Absolute: 0 10*3/uL (ref 0.0–0.1)
EOS PCT: 1 % (ref 0.0–5.0)
Eosinophils Absolute: 0.1 10*3/uL (ref 0.0–0.7)
HCT: 34 % — ABNORMAL LOW (ref 36.0–46.0)
HEMOGLOBIN: 11.7 g/dL — AB (ref 12.0–15.0)
Lymphocytes Relative: 24.7 % (ref 12.0–46.0)
Lymphs Abs: 1.3 10*3/uL (ref 0.7–4.0)
MCHC: 34.4 g/dL (ref 30.0–36.0)
MCV: 96.8 fl (ref 78.0–100.0)
Monocytes Absolute: 0.5 10*3/uL (ref 0.1–1.0)
Monocytes Relative: 9.4 % (ref 3.0–12.0)
NEUTROS ABS: 3.3 10*3/uL (ref 1.4–7.7)
Neutrophils Relative %: 64.4 % (ref 43.0–77.0)
Platelets: 199 10*3/uL (ref 150.0–400.0)
RBC: 3.52 Mil/uL — ABNORMAL LOW (ref 3.87–5.11)
RDW: 13.2 % (ref 11.5–15.5)
WBC: 5.1 10*3/uL (ref 4.0–10.5)

## 2018-08-17 LAB — TSH: TSH: 1.89 u[IU]/mL (ref 0.35–4.50)

## 2018-08-17 LAB — HEPATIC FUNCTION PANEL
ALBUMIN: 4.2 g/dL (ref 3.5–5.2)
ALK PHOS: 46 U/L (ref 39–117)
ALT: 18 U/L (ref 0–35)
AST: 26 U/L (ref 0–37)
Bilirubin, Direct: 0.1 mg/dL (ref 0.0–0.3)
Total Bilirubin: 0.5 mg/dL (ref 0.2–1.2)
Total Protein: 8.3 g/dL (ref 6.0–8.3)

## 2018-08-17 LAB — LIPID PANEL
CHOLESTEROL: 146 mg/dL (ref 0–200)
HDL: 38.5 mg/dL — ABNORMAL LOW (ref >39.00)
NonHDL: 107.59
Total CHOL/HDL Ratio: 4
Triglycerides: 218 mg/dL — ABNORMAL HIGH (ref 0.0–149.0)
VLDL: 43.6 mg/dL — ABNORMAL HIGH (ref 0.0–40.0)

## 2018-08-17 LAB — LDL CHOLESTEROL, DIRECT: Direct LDL: 78 mg/dL

## 2018-08-17 NOTE — Assessment & Plan Note (Signed)
Chronic problem.  Tolerating statin w/o difficulty.  Check labs.  Adjust meds prn  

## 2018-08-17 NOTE — Assessment & Plan Note (Signed)
Chronic problem.  Following w/ Dr Delice Lesch.  On Aricept.  Daughter is moving in w/ her which is best for everyone.  Will follow.

## 2018-08-17 NOTE — Patient Instructions (Signed)
Schedule your complete physical in 6 months We'll notify you of your lab results and make any changes if needed Continue to eat 3 meals a day and snack in between Keep up the good work!  You look great! Call with any questions or concerns Happy Holidays!

## 2018-08-17 NOTE — Progress Notes (Signed)
   Subjective:    Patient ID: Breanna Yang, female    DOB: Dec 26, 1933, 84 y.o.   MRN: [id number]  HPI HTN- chronic problem, on Metoprolol 50mg  daily w/ good control.  'I have really been feeling good'.  No CP, SOB, HAs, visual changes, edema.  Hyperlipidemia- chronic problem, on Simvastatin 40mg  daily.  Denies abd pain, N/V.  Dementia- chronic problem, on Aricept 10mg  daily, following w/ Dr Delice Lesch.  Daughter [facility]) is moving back in w/ pt at end of the month.  Pt is very happy about this.   Review of Systems For ROS see HPI     Objective:   Physical Exam  Constitutional: She appears well-developed and well-nourished. No distress.  HENT:  Head: Normocephalic and atraumatic.  Eyes: Pupils are equal, round, and reactive to light. Conjunctivae and EOM are normal.  Neck: Normal range of motion. Neck supple. No thyromegaly present.  Cardiovascular: Normal rate, regular rhythm, normal heart sounds and intact distal pulses.  No murmur heard. Pulmonary/Chest: Effort normal and breath sounds normal. No respiratory distress.  Abdominal: Soft. She exhibits no distension. There is no tenderness.  Musculoskeletal: She exhibits no edema.  Lymphadenopathy:    She has no cervical adenopathy.  Neurological: She is alert.  Oriented to person, place  Skin: Skin is warm and dry.  Psychiatric: She has a normal mood and affect. Her behavior is normal.  Vitals reviewed.         Assessment & Plan:

## 2018-08-17 NOTE — Assessment & Plan Note (Signed)
Chronic problem.  Well controlled today.  Asymptomatic.  Check labs.  No anticipated med changes. 

## 2018-08-18 ENCOUNTER — Ambulatory Visit
Admission: RE | Admit: 2018-08-18 | Discharge: 2018-08-18 | Disposition: A | Discharge status: Home / Self Care | Payer: Medicare HMO | Source: Ambulatory Visit | Attending: Family Medicine | Admitting: Family Medicine

## 2018-08-18 DIAGNOSIS — Z1231 Encounter for screening mammogram for malignant neoplasm of breast: Secondary | ICD-10-CM | POA: Diagnosis not present

## 2018-10-02 ENCOUNTER — Other Ambulatory Visit: Payer: Self-pay | Admitting: Family Medicine

## 2018-12-10 DIAGNOSIS — D485 Neoplasm of uncertain behavior of skin: Secondary | ICD-10-CM | POA: Diagnosis not present

## 2018-12-10 DIAGNOSIS — L570 Actinic keratosis: Secondary | ICD-10-CM | POA: Diagnosis not present

## 2018-12-10 DIAGNOSIS — L821 Other seborrheic keratosis: Secondary | ICD-10-CM | POA: Diagnosis not present

## 2018-12-10 DIAGNOSIS — C44719 Basal cell carcinoma of skin of left lower limb, including hip: Secondary | ICD-10-CM | POA: Diagnosis not present

## 2018-12-10 DIAGNOSIS — D225 Melanocytic nevi of trunk: Secondary | ICD-10-CM | POA: Diagnosis not present

## 2018-12-29 ENCOUNTER — Other Ambulatory Visit: Payer: Self-pay | Admitting: Family Medicine

## 2018-12-30 ENCOUNTER — Other Ambulatory Visit: Payer: Self-pay | Admitting: Family Medicine

## 2018-12-30 MED ORDER — PAROXETINE HCL 20 MG PO TABS: 20.0000 mg | ORAL_TABLET | Freq: Every day | ORAL | 1 refills | Status: DC

## 2019-02-24 ENCOUNTER — Encounter: Payer: Medicare HMO | Admitting: Family Medicine

## 2019-02-24 DIAGNOSIS — C44719 Basal cell carcinoma of skin of left lower limb, including hip: Secondary | ICD-10-CM | POA: Diagnosis not present

## 2019-03-23 ENCOUNTER — Other Ambulatory Visit: Payer: Self-pay | Admitting: Family Medicine

## 2019-04-18 DIAGNOSIS — H5213 Myopia, bilateral: Secondary | ICD-10-CM | POA: Diagnosis not present

## 2019-04-18 DIAGNOSIS — H524 Presbyopia: Secondary | ICD-10-CM | POA: Diagnosis not present

## 2019-04-18 DIAGNOSIS — H52203 Unspecified astigmatism, bilateral: Secondary | ICD-10-CM | POA: Diagnosis not present

## 2019-04-22 ENCOUNTER — Other Ambulatory Visit: Payer: Self-pay | Admitting: Family Medicine

## 2019-04-29 ENCOUNTER — Ambulatory Visit (INDEPENDENT_AMBULATORY_CARE_PROVIDER_SITE_OTHER): Payer: Medicare HMO | Admitting: Family Medicine

## 2019-04-29 ENCOUNTER — Encounter: Payer: Self-pay | Admitting: Family Medicine

## 2019-04-29 ENCOUNTER — Other Ambulatory Visit: Payer: Self-pay

## 2019-04-29 VITALS — BP 121/60 | HR 88 | Temp 97.0°F | Resp 17 | Ht 65.0 in | Wt 125.1 lb

## 2019-04-29 DIAGNOSIS — E785 Hyperlipidemia, unspecified: Secondary | ICD-10-CM | POA: Diagnosis not present

## 2019-04-29 DIAGNOSIS — M810 Age-related osteoporosis without current pathological fracture: Secondary | ICD-10-CM

## 2019-04-29 DIAGNOSIS — Z0000 Encounter for general adult medical examination without abnormal findings: Secondary | ICD-10-CM

## 2019-04-29 NOTE — Assessment & Plan Note (Signed)
Pt's PE unchanged from previous.  UTD on mammo and immunizations.  Check labs.  Anticipatory guidance provided.

## 2019-04-29 NOTE — Assessment & Plan Note (Signed)
Pt has hx of this.  Check Vit D and replete prn.

## 2019-04-29 NOTE — Patient Instructions (Signed)
Follow up in 6 months to recheck BP and cholesterol We'll notify you of your lab results and make any changes if needed Make sure you are eating regularly Call with any questions or concerns Stay Safe!!!

## 2019-04-29 NOTE — Assessment & Plan Note (Signed)
Tolerating statin w/o difficulty.  Check labs.  Adjust meds prn  

## 2019-04-29 NOTE — Progress Notes (Signed)
   Subjective:    Patient ID: Breanna Yang, female    DOB: 02/24/1934, 85 y.o.   MRN: [id number]  HPI CPE- UTD on mammo, immunizations.  No concerns today.   Review of Systems Patient reports no vision changes, adenopathy,fever, weight change,  persistant/recurrent hoarseness , swallowing issues, chest pain, palpitations, edema, persistant/recurrent cough, hemoptysis, dyspnea (rest/exertional/paroxysmal nocturnal), gastrointestinal bleeding (melena, rectal bleeding), abdominal pain, significant heartburn, bowel changes, GU symptoms (dysuria, hematuria, incontinence), Gyn symptoms (abnormal  bleeding, pain),  syncope, focal weakness, memory loss, numbness & tingling, hair/nail changes, abnormal bruising or bleeding, anxiety, or depression.   L lower leg skin cancer tx'd by derm recently + hard of hearing    Objective:   Physical Exam General Appearance:    Alert, cooperative, no distress, appears stated age  Head:    Normocephalic, without obvious abnormality, atraumatic  Eyes:    PERRL, conjunctiva/corneas clear, EOM's intact  Ears:    Normal TM's and external ear canals, both ears  Nose:   Nares normal, septum midline, mucosa normal, no drainage    or sinus tenderness  Throat:   Deferred due to COVID  Neck:   Supple, symmetrical, trachea midline, no adenopathy;    Thyroid: no enlargement/tenderness/nodules  Back:     Symmetric, no curvature, ROM normal, no CVA tenderness  Lungs:     Clear to auscultation bilaterally, respirations unlabored  Chest Wall:    No tenderness or deformity   Heart:    Regular rate and rhythm, S1 and S2 normal, no murmur, rub   or gallop  Breast Exam:    Deferred to mammo  Abdomen:     Soft, non-tender, bowel sounds active all four quadrants,    no masses, no organomegaly  Genitalia:    Deferred  Rectal:    Extremities:   Extremities normal, atraumatic, no cyanosis or edema  Pulses:   2+ and symmetric all extremities  Skin:   Skin color, texture,  turgor normal, no rashes or lesions  Lymph nodes:   Cervical, supraclavicular, and axillary nodes normal  Neurologic:   CNII-XII intact, normal strength, sensation and reflexes    throughout          Assessment & Plan:

## 2019-04-30 LAB — CBC WITH DIFFERENTIAL/PLATELET
Absolute Monocytes: 489 cells/uL (ref 200–950)
Basophils Absolute: 19 cells/uL (ref 0–200)
Basophils Relative: 0.4 %
Eosinophils Absolute: 71 cells/uL (ref 15–500)
Eosinophils Relative: 1.5 %
HCT: 32.1 % — ABNORMAL LOW (ref 35.0–45.0)
Hemoglobin: 11 g/dL — ABNORMAL LOW (ref 11.7–15.5)
Lymphs Abs: 1264 cells/uL (ref 850–3900)
MCH: 33.2 pg — ABNORMAL HIGH (ref 27.0–33.0)
MCHC: 34.3 g/dL (ref 32.0–36.0)
MCV: 97 fL (ref 80.0–100.0)
MPV: 12 fL (ref 7.5–12.5)
Monocytes Relative: 10.4 %
Neutro Abs: 2858 cells/uL (ref 1500–7800)
Neutrophils Relative %: 60.8 %
Platelets: 162 10*3/uL (ref 140–400)
RBC: 3.31 10*6/uL — ABNORMAL LOW (ref 3.80–5.10)
RDW: 12.7 % (ref 11.0–15.0)
Total Lymphocyte: 26.9 %
WBC: 4.7 10*3/uL (ref 3.8–10.8)

## 2019-04-30 LAB — BASIC METABOLIC PANEL
BUN/Creatinine Ratio: 26 (calc) — ABNORMAL HIGH (ref 6–22)
BUN: 24 mg/dL (ref 7–25)
CO2: 21 mmol/L (ref 20–32)
Calcium: 9.6 mg/dL (ref 8.6–10.4)
Chloride: 103 mmol/L (ref 98–110)
Creat: 0.93 mg/dL — ABNORMAL HIGH (ref 0.60–0.88)
Glucose, Bld: 76 mg/dL (ref 65–99)
Potassium: 4.4 mmol/L (ref 3.5–5.3)
Sodium: 138 mmol/L (ref 135–146)

## 2019-04-30 LAB — LIPID PANEL
Cholesterol: 168 mg/dL (ref <200)
HDL: 37 mg/dL — ABNORMAL LOW (ref >=50)
Non-HDL Cholesterol (Calc): 131 mg/dL (calc) — ABNORMAL HIGH (ref <130)
Total CHOL/HDL Ratio: 4.5 (calc) (ref <5.0)
Triglycerides: 412 mg/dL — ABNORMAL HIGH (ref <150)

## 2019-04-30 LAB — HEPATIC FUNCTION PANEL
AG Ratio: 1.1 (calc) (ref 1.0–2.5)
ALT: 9 U/L (ref 6–29)
AST: 19 U/L (ref 10–35)
Albumin: 4.1 g/dL (ref 3.6–5.1)
Alkaline phosphatase (APISO): 46 U/L (ref 37–153)
Bilirubin, Direct: 0 mg/dL (ref 0.0–0.2)
Globulin: 3.8 g/dL (calc) — ABNORMAL HIGH (ref 1.9–3.7)
Indirect Bilirubin: 0.3 mg/dL (calc) (ref 0.2–1.2)
Total Bilirubin: 0.3 mg/dL (ref 0.2–1.2)
Total Protein: 7.9 g/dL (ref 6.1–8.1)

## 2019-04-30 LAB — TSH: TSH: 1.97 mIU/L (ref 0.40–4.50)

## 2019-04-30 LAB — VITAMIN D 25 HYDROXY (VIT D DEFICIENCY, FRACTURES): Vit D, 25-Hydroxy: 20 ng/mL — ABNORMAL LOW (ref 30–100)

## 2019-05-03 ENCOUNTER — Other Ambulatory Visit: Payer: Self-pay | Admitting: General Practice

## 2019-05-03 ENCOUNTER — Encounter: Payer: Self-pay | Admitting: General Practice

## 2019-05-03 MED ORDER — FENOFIBRATE 160 MG PO TABS: 160.0000 mg | ORAL_TABLET | Freq: Every day | ORAL | 1 refills | Status: DC

## 2019-05-03 MED ORDER — VITAMIN D (ERGOCALCIFEROL) 1.25 MG (50000 UNIT) PO CAPS: 50000.0000 [IU] | ORAL_CAPSULE | ORAL | 0 refills | Status: DC

## 2019-06-02 DIAGNOSIS — R69 Illness, unspecified: Secondary | ICD-10-CM | POA: Diagnosis not present

## 2019-06-10 ENCOUNTER — Ambulatory Visit (INDEPENDENT_AMBULATORY_CARE_PROVIDER_SITE_OTHER): Payer: Medicare HMO | Admitting: Podiatry

## 2019-06-10 ENCOUNTER — Encounter: Payer: Self-pay | Admitting: Podiatry

## 2019-06-10 ENCOUNTER — Other Ambulatory Visit: Payer: Self-pay

## 2019-06-10 DIAGNOSIS — M2042 Other hammer toe(s) (acquired), left foot: Secondary | ICD-10-CM

## 2019-06-10 DIAGNOSIS — L84 Corns and callosities: Secondary | ICD-10-CM | POA: Diagnosis not present

## 2019-06-10 DIAGNOSIS — M2041 Other hammer toe(s) (acquired), right foot: Secondary | ICD-10-CM

## 2019-06-11 NOTE — Progress Notes (Signed)
Subjective:   Patient ID: Breanna Yang, female   DOB: 85 y.o.   MRN: [id number]   HPI Patient presents with lesions on both feet that can become painful and also has digital deformities bilateral and structural malalignment creating the corn callus formation   ROS      Objective:  Physical Exam  Neurovascular status intact with patient found to have keratotic lesion bilateral which can be painful along with structural changes with pressure between the hallux second digit bilateral     Assessment:  Hammertoe deformities bilateral with keratotic lesion formation bilateral that are painful     Plan:  H&P discussed condition debridement accomplished today with padding discussed digital correction did not recommend currently and would prefer to continue conservative care

## 2019-06-23 ENCOUNTER — Other Ambulatory Visit: Payer: Self-pay | Admitting: Family Medicine

## 2019-07-04 ENCOUNTER — Other Ambulatory Visit: Payer: Self-pay | Admitting: Family Medicine

## 2019-07-13 ENCOUNTER — Other Ambulatory Visit: Payer: Self-pay | Admitting: Family Medicine

## 2019-07-13 DIAGNOSIS — Z1231 Encounter for screening mammogram for malignant neoplasm of breast: Secondary | ICD-10-CM

## 2019-07-18 ENCOUNTER — Other Ambulatory Visit: Payer: Self-pay | Admitting: Family Medicine

## 2019-08-12 ENCOUNTER — Encounter: Payer: Self-pay | Admitting: Podiatry

## 2019-08-12 ENCOUNTER — Ambulatory Visit (INDEPENDENT_AMBULATORY_CARE_PROVIDER_SITE_OTHER): Payer: Medicare HMO

## 2019-08-12 ENCOUNTER — Ambulatory Visit: Payer: Medicare HMO | Admitting: Podiatry

## 2019-08-12 ENCOUNTER — Other Ambulatory Visit: Payer: Self-pay

## 2019-08-12 DIAGNOSIS — M2041 Other hammer toe(s) (acquired), right foot: Secondary | ICD-10-CM

## 2019-08-12 DIAGNOSIS — M2042 Other hammer toe(s) (acquired), left foot: Secondary | ICD-10-CM

## 2019-08-12 DIAGNOSIS — L84 Corns and callosities: Secondary | ICD-10-CM

## 2019-08-16 NOTE — Progress Notes (Signed)
Subjective:   Patient ID: Breanna Yang, female   DOB: 85 y.o.   MRN: [id number]   HPI Patient presents stating these calluses are really bothering me and I may need surgery on my toes as I do not seem to be improving   ROS      Objective:  Physical Exam  Neurovascular status intact with keratotic lesions between the hallux and second digit bilateral with rotation of the big toes against the second toes bilateral with thick keratotic lesion formation around the second digit bilateral     Assessment:  Lesion formation of the hallux and second digits bilateral with hammertoe deformity and exostosis     Plan:  H&P reviewed both conditions and today debridement accomplished and I did explain possibility for hammertoe repair if symptoms do not improve with continued conservative care.  Patient will be seen back when symptomatic and pads were dispensed today  X-rays dated today did indicate significant structural malalignment between the hallux and second digits bilateral with pressure occurring between the adjacent digits

## 2019-08-29 ENCOUNTER — Other Ambulatory Visit: Payer: Self-pay

## 2019-08-29 DIAGNOSIS — Z20822 Contact with and (suspected) exposure to covid-19: Secondary | ICD-10-CM

## 2019-08-30 LAB — NOVEL CORONAVIRUS, NAA: SARS-CoV-2, NAA: DETECTED — AB

## 2019-08-31 ENCOUNTER — Encounter: Payer: Self-pay | Admitting: Physician Assistant

## 2019-08-31 ENCOUNTER — Other Ambulatory Visit: Payer: Self-pay | Admitting: Nurse Practitioner

## 2019-08-31 ENCOUNTER — Other Ambulatory Visit: Payer: Self-pay

## 2019-08-31 ENCOUNTER — Ambulatory Visit: Payer: Medicare HMO

## 2019-08-31 ENCOUNTER — Ambulatory Visit (INDEPENDENT_AMBULATORY_CARE_PROVIDER_SITE_OTHER): Payer: Medicare HMO | Admitting: Physician Assistant

## 2019-08-31 VITALS — HR 75 | Temp 98.7°F

## 2019-08-31 DIAGNOSIS — U071 COVID-19: Secondary | ICD-10-CM

## 2019-08-31 DIAGNOSIS — I10 Essential (primary) hypertension: Secondary | ICD-10-CM

## 2019-08-31 MED ORDER — BENZONATATE 100 MG PO CAPS: 100.0000 mg | ORAL_CAPSULE | Freq: Three times a day (TID) | ORAL | 0 refills | Status: DC | PRN

## 2019-08-31 NOTE — Progress Notes (Signed)
I have discussed the procedure for the virtual visit with the patient who has given consent to proceed with assessment and treatment.   Cadden Elizondo S Manasvi Dickard, CMA     

## 2019-08-31 NOTE — Progress Notes (Signed)
Virtual Visit via Video   I connected with patient on 08/31/19 at 10:00 AM EST by a video enabled telemedicine application and verified that I am speaking with the correct person using two identifiers.  Location patient: Home Location provider: [facility], Office Persons participating in the virtual visit: Patient, Provider, [place] (Patina Moore)  I discussed the limitations of evaluation and management by telemedicine and the availability of in person appointments. The patient expressed understanding and agreed to proceed.  Subjective:   HPI:   Patient presents via [employer] today to discuss symptoms. Patient tested positive for COVID yesterday. Symptoms started some time last week. Patient notes dry but persistent cough. Denies any SOB or wheezing. Did note onset of fever last night with Tmax 100.4. Normal this morning. Daughter and son-in-law have [place] as well. Denies sinus pressure, sinus pain, ear pain, chest pain. No acute loss of taste or smell. Denies nausea, vomiting or diarrhea.   ROS:   See pertinent positives and negatives per HPI.  Patient Active Problem List   Diagnosis Date Noted  . Vascular dementia without behavioral disturbance ([place]) 10/13/2017  . Mild cognitive impairment 04/06/2017  . Hx of syncope 04/06/2017  . Gait instability 04/06/2017  . Physical exam 01/06/2017  . GERD (gastroesophageal reflux disease) 11/13/2016  . Encephalomalacia 11/13/2016  . HTN (hypertension) 07/04/2016  . Hyperlipidemia 07/04/2016  . Osteoporosis 07/04/2016    Social History   Tobacco Use  . Smoking status: Never Smoker  . Smokeless tobacco: Never Used  Substance Use Topics  . Alcohol use: No    Current Outpatient Medications:  .  aspirin 81 MG tablet, Take 81 mg by mouth daily., Disp: , Rfl:  .  Cholecalciferol (VITAMIN D) 2000 units tablet, Take 2,000 Units by mouth daily., Disp: , Rfl:  .  donepezil (ARICEPT) 10 MG tablet, Take 1 tablet daily, Disp: 30 tablet,  Rfl: 11 .  ENSURE (ENSURE), Take 237 mLs by mouth at bedtime., Disp: , Rfl:  .  fenofibrate 160 MG tablet, Take 1 tablet (160 mg total) by mouth daily., Disp: 90 tablet, Rfl: 1 .  metoprolol succinate (TOPROL-XL) 50 MG 24 hr tablet, TAKE 1 TABLET BY MOUTH EVERY DAY WITH OR IMMEDIATELY FOLLOWING A MEAL, Disp: 90 tablet, Rfl: 0 .  Multiple Vitamin (MULTIVITAMIN) capsule, Take 1 capsule by mouth daily., Disp: , Rfl:  .  PARoxetine (PAXIL) 20 MG tablet, TAKE 1 TABLET BY MOUTH EVERY DAY, Disp: 90 tablet, Rfl: 1 .  simvastatin (ZOCOR) 40 MG tablet, TAKE 1 TABLET BY MOUTH EVERY EVENING AT 6PM, Disp: 90 tablet, Rfl: 1 .  vitamin B-12 (CYANOCOBALAMIN) 500 MCG tablet, Take 500 mcg by mouth daily., Disp: , Rfl:   No Known Allergies  Objective:   Pulse 75   Temp 98.7 F (37.1 C) (Oral)   SpO2 98%   Patient is well-developed, well-nourished in no acute distress.  Resting comfortably at home.  Head is normocephalic, atraumatic.  No labored breathing.  Speech is clear and coherent with logical content.  Patient is alert and oriented at baseline.   Assessment and Plan:   1. COVID-19 + test result. Thankfully mild symptoms. Rx Tessalon for cough. Supportive measures and OTC medications reviewed. Daughter is keeping watch on vitals, including pulse ox. They are setting up her MyChart so we can enroll her in [place] monitoring programs. Strict ER precautions reviewed with patient and daughter who voiced understanding and agreement with the plan.  - benzonatate (TESSALON) 100 MG capsule; Take 1  capsule (100 mg total) by mouth 3 (three) times daily as needed for cough.  Dispense: 30 capsule; Refill: 0    Leeanne Rio, [place] 08/31/2019

## 2019-08-31 NOTE — Progress Notes (Signed)
  I connected by phone with Breanna Yang on 08/31/2019 at 3:41 PM to discuss the potential use of an new treatment for mild to moderate COVID-19 viral infection in non-hospitalized patients.  This patient is a 85 y.o. female that meets the FDA criteria for Emergency Use Authorization of bamlanivimab:  Has a (+) direct SARS-CoV-2 viral test result  Has mild or moderate COVID-19   Is ? 85 years of age and weighs ? 40 kg  Is NOT hospitalized due to COVID-19  Is NOT requiring oxygen therapy or requiring an increase in baseline oxygen flow rate due to COVID-19  Is within 10 days of symptom onset  Has at least one of the high risk factor(s) for progression to severe COVID-19 and/or hospitalization as defined in EUA.  Specific high risk criteria : Hypertension Patient is being managed for the following issues: Patient Active Problem List   Diagnosis Date Noted  . Vascular dementia without behavioral disturbance ([place]) 10/13/2017  . Mild cognitive impairment 04/06/2017  . Hx of syncope 04/06/2017  . Gait instability 04/06/2017  . Physical exam 01/06/2017  . GERD (gastroesophageal reflux disease) 11/13/2016  . Encephalomalacia 11/13/2016  . HTN (hypertension) 07/04/2016  . Hyperlipidemia 07/04/2016  . Osteoporosis 07/04/2016    I have spoken and communicated the following to the patient or parent/caregiver:  1. FDA has authorized the emergency use of bamlanivimab for the treatment of mild to moderate COVID-19 in adults and pediatric patients with positive results of direct SARS-CoV-2 viral testing who are 42 years of age and older weighing at least 40 kg, and who are at high risk for progressing to severe COVID-19 and/or hospitalization.  2. The significant known and potential risks and benefits of bamlanivimab, and the extent to which such potential risks and benefits are unknown.  3. Information on available alternative treatments and the risks and benefits of those alternatives,  including clinical trials.  4. Patients treated with bamlanivimab should continue to self-isolate and use infection control measures (e.g., wear mask, isolate, social distance, avoid sharing personal items, clean and disinfect "high touch" surfaces, and frequent handwashing) according to CDC guidelines.   5. The patient or parent/caregiver has the option to accept or refuse bamlanivimab.  After reviewing this information with the patient, The patient agreed to proceed with receiving the infusion of bamlanivimab and will be provided a copy of the Fact sheet prior to receiving the infusion.  [name] 08/31/2019 3:41 PM

## 2019-09-01 ENCOUNTER — Ambulatory Visit (HOSPITAL_COMMUNITY)
Admission: RE | Admit: 2019-09-01 | Discharge: 2019-09-01 | Disposition: A | Discharge status: Home / Self Care | Payer: Medicare Other | Source: Ambulatory Visit | Attending: Critical Care Medicine | Admitting: Critical Care Medicine

## 2019-09-01 ENCOUNTER — Encounter (HOSPITAL_COMMUNITY): Payer: Self-pay

## 2019-09-01 DIAGNOSIS — Z23 Encounter for immunization: Secondary | ICD-10-CM | POA: Insufficient documentation

## 2019-09-01 DIAGNOSIS — U071 COVID-19: Secondary | ICD-10-CM | POA: Diagnosis not present

## 2019-09-01 DIAGNOSIS — I10 Essential (primary) hypertension: Secondary | ICD-10-CM | POA: Diagnosis not present

## 2019-09-01 MED ORDER — ALBUTEROL SULFATE HFA 108 (90 BASE) MCG/ACT IN AERS: 2.0000 | INHALATION_SPRAY | Freq: Once | RESPIRATORY_TRACT | Status: DC | PRN

## 2019-09-01 MED ORDER — SODIUM CHLORIDE 0.9 % IV SOLN: INTRAVENOUS | Status: DC | PRN

## 2019-09-01 MED ORDER — EPINEPHRINE 0.3 MG/0.3ML IJ SOAJ: 0.3000 mg | Freq: Once | INTRAMUSCULAR | Status: DC | PRN

## 2019-09-01 MED ORDER — METHYLPREDNISOLONE SODIUM SUCC 125 MG IJ SOLR: 125.0000 mg | Freq: Once | INTRAMUSCULAR | Status: DC | PRN

## 2019-09-01 MED ORDER — DIPHENHYDRAMINE HCL 50 MG/ML IJ SOLN: 50.0000 mg | Freq: Once | INTRAMUSCULAR | Status: DC | PRN

## 2019-09-01 MED ORDER — SODIUM CHLORIDE 0.9 % IV SOLN
700.0000 mg | Freq: Once | INTRAVENOUS | Status: AC
  Administered 2019-09-01: 700 mg via INTRAVENOUS
  Filled 2019-09-01: qty 20

## 2019-09-01 MED ORDER — FAMOTIDINE IN NACL 20-0.9 MG/50ML-% IV SOLN: 20.0000 mg | Freq: Once | INTRAVENOUS | Status: DC | PRN

## 2019-09-01 NOTE — Progress Notes (Signed)
Patient ID: Breanna Yang, female   DOB: Nov 20, 1933, 85 y.o.   MRN: [id number]   Diagnosis: [id number]  Physician: Midge Minium, MD  Procedure: bamlanivimab infusion Provided patient with bamlanivimab fact sheet for patients, parents and caregivers prior to infusion.  Complications: No immediate complications noted.  Discharge: Discharged home   Gar Ponto 09/01/2019

## 2019-09-28 ENCOUNTER — Other Ambulatory Visit: Payer: Self-pay | Admitting: Family Medicine

## 2019-10-18 ENCOUNTER — Ambulatory Visit: Payer: Medicare HMO | Attending: Internal Medicine

## 2019-10-18 DIAGNOSIS — Z23 Encounter for immunization: Secondary | ICD-10-CM | POA: Insufficient documentation

## 2019-10-18 NOTE — Progress Notes (Signed)
   Covid-19 Vaccination Clinic  Name:  Breanna Yang    MRN: [id number] DOB: [birth year]  10/18/2019  Breanna Yang was observed post Covid-19 immunization for 15 minutes without incidence. She was provided with Vaccine Information Sheet and instruction to access the V-Safe system.   Breanna Yang was instructed to call 911 with any severe reactions post vaccine: Marland Kitchen Difficulty breathing  . Swelling of your face and throat  . A fast heartbeat  . A bad rash all over your body  . Dizziness and weakness    Immunizations Administered    Name Date Dose VIS Date Route   Pfizer COVID-19 Vaccine 10/18/2019  3:01 PM 0.3 mL 09/09/2019 Intramuscular   Manufacturer: [place]   Lot: [id number]   [place]: [id number]

## 2019-10-31 ENCOUNTER — Other Ambulatory Visit: Payer: Self-pay

## 2019-10-31 ENCOUNTER — Ambulatory Visit (INDEPENDENT_AMBULATORY_CARE_PROVIDER_SITE_OTHER): Payer: Medicare HMO | Admitting: Family Medicine

## 2019-10-31 ENCOUNTER — Encounter: Payer: Self-pay | Admitting: Family Medicine

## 2019-10-31 DIAGNOSIS — Z8616 Personal history of COVID-19: Secondary | ICD-10-CM | POA: Diagnosis not present

## 2019-10-31 DIAGNOSIS — F0150 Vascular dementia without behavioral disturbance: Secondary | ICD-10-CM

## 2019-10-31 DIAGNOSIS — E785 Hyperlipidemia, unspecified: Secondary | ICD-10-CM | POA: Diagnosis not present

## 2019-10-31 DIAGNOSIS — I10 Essential (primary) hypertension: Secondary | ICD-10-CM | POA: Diagnosis not present

## 2019-10-31 NOTE — Progress Notes (Signed)
Virtual Visit via Video   I connected with patient on 10/31/19 at 10:30 AM EST by a video enabled telemedicine application and verified that I am speaking with the correct person using two identifiers.  Location patient: Home Location provider: [facility], Office Persons participating in the virtual visit: Patient, Provider, [place] (Jess B)  I discussed the limitations of evaluation and management by telemedicine and the availability of in person appointments. The patient expressed understanding and agreed to proceed.  Subjective:   HPI:  HTN- chronic problem, on Metoprolol 50mg  daily.  No CP, SOB, HAs, visual changes, edema.  Hyperlipidemia- chronic problem, on Fenofibrate 160mg  daily and Simvastatin 40mg  daily.  No abd pain, N/V.  COVID- pt tested + for COVID on 11/30.  Received Bamlanivimab infusion on 12/3.  Reports she felt badly x13 days but has since recovered.  Is now back home after staying w/ daughter x1 month while she was recuperating.  'i'm doing wonderful!'  Pt received her first immunization and has 2nd shot scheduled.  Dementia- ongoing issue for pt.  On Aricept 10mg  daily.  Pt lives alone but has good family support.  Kids are local and sister lives next door.  Pt reports feeling safe at home.  Pt has gun at the house but it is stored at the 'top of the closet' and she has 'never taken it down'.  ROS:   See pertinent positives and negatives per HPI.  Patient Active Problem List   Diagnosis Date Noted  . Vascular dementia without behavioral disturbance ([place]) 10/13/2017  . Mild cognitive impairment 04/06/2017  . Hx of syncope 04/06/2017  . Gait instability 04/06/2017  . Physical exam 01/06/2017  . GERD (gastroesophageal reflux disease) 11/13/2016  . Encephalomalacia 11/13/2016  . HTN (hypertension) 07/04/2016  . Hyperlipidemia 07/04/2016  . Osteoporosis 07/04/2016    Social History   Tobacco Use  . Smoking status: Never Smoker  . Smokeless tobacco:  Never Used  Substance Use Topics  . Alcohol use: No    Current Outpatient Medications:  .  aspirin 81 MG tablet, Take 81 mg by mouth daily., Disp: , Rfl:  .  Cholecalciferol (VITAMIN D) 2000 units tablet, Take 2,000 Units by mouth daily., Disp: , Rfl:  .  donepezil (ARICEPT) 10 MG tablet, Take 1 tablet daily, Disp: 30 tablet, Rfl: 11 .  ENSURE (ENSURE), Take 237 mLs by mouth at bedtime., Disp: , Rfl:  .  fenofibrate 160 MG tablet, TAKE 1 TABLET BY MOUTH EVERY DAY, Disp: 90 tablet, Rfl: 1 .  metoprolol succinate (TOPROL-XL) 50 MG 24 hr tablet, TAKE 1 TABLET BY MOUTH EVERY DAY WITH OR IMMEDIATELY FOLLOWING A MEAL, Disp: 90 tablet, Rfl: 0 .  Multiple Vitamin (MULTIVITAMIN) capsule, Take 1 capsule by mouth daily., Disp: , Rfl:  .  PARoxetine (PAXIL) 20 MG tablet, TAKE 1 TABLET BY MOUTH EVERY DAY, Disp: 90 tablet, Rfl: 1 .  simvastatin (ZOCOR) 40 MG tablet, TAKE 1 TABLET BY MOUTH EVERY EVENING AT 6PM, Disp: 90 tablet, Rfl: 1 .  vitamin B-12 (CYANOCOBALAMIN) 500 MCG tablet, Take 500 mcg by mouth daily., Disp: , Rfl:   No Known Allergies  Objective:   There were no vitals taken for this visit. Pt is able to speak clearly, coherently without shortness of breath or increased work of breathing. Thought process is linear.  Mood is appropriate.   Assessment and Plan:   HTN- chronic problem, pt not able to check BP today but will get reading when she comes for  labs.  Currently asymptomatic.  If well controlled, no anticipated med changes.  Will follow.  Hyperlipidemia- chronic problem.  Tolerating medication w/o difficulty.  Check labs.  Adjust meds prn   Dementia- ongoing issue for pt.  sxs currently stable.  Has good family support but does live alone.  I told her I would prefer her to give the gun to someone else and not keep it in the home.  She will consider this.  No changes at this time.  Hx of COVID- pt has recovered and has already had her 1st vaccine.  She reports she is doing 'great'.   Will monitor for any long term complications.   Annye Asa, MD 10/31/2019  Time spent with the patient: 12 minutes, of which >50% was spent in obtaining information about symptoms, reviewing previous labs, evaluations, and treatments, counseling about condition (please see the discussed topics above), and developing a plan to further investigate it; had a number of questions which I addressed.

## 2019-10-31 NOTE — Progress Notes (Signed)
I have discussed the procedure for the virtual visit with the patient who has given consent to proceed with assessment and treatment.   Pt unable to obtain vitals.   Jessica L Brodmerkel, CMA     

## 2019-11-02 ENCOUNTER — Other Ambulatory Visit: Payer: Self-pay

## 2019-11-02 ENCOUNTER — Ambulatory Visit (INDEPENDENT_AMBULATORY_CARE_PROVIDER_SITE_OTHER): Payer: Medicare HMO

## 2019-11-02 DIAGNOSIS — E785 Hyperlipidemia, unspecified: Secondary | ICD-10-CM | POA: Diagnosis not present

## 2019-11-02 DIAGNOSIS — I10 Essential (primary) hypertension: Secondary | ICD-10-CM | POA: Diagnosis not present

## 2019-11-02 LAB — CBC WITH DIFFERENTIAL/PLATELET
Basophils Absolute: 0 10*3/uL (ref 0.0–0.1)
Basophils Relative: 1.3 % (ref 0.0–3.0)
Eosinophils Absolute: 0 10*3/uL (ref 0.0–0.7)
Eosinophils Relative: 0.7 % (ref 0.0–5.0)
HCT: 29.8 % — ABNORMAL LOW (ref 36.0–46.0)
Hemoglobin: 10.1 g/dL — ABNORMAL LOW (ref 12.0–15.0)
Lymphocytes Relative: 24.1 % (ref 12.0–46.0)
Lymphs Abs: 0.9 10*3/uL (ref 0.7–4.0)
MCHC: 33.7 g/dL (ref 30.0–36.0)
MCV: 100.6 fl — ABNORMAL HIGH (ref 78.0–100.0)
Monocytes Absolute: 0.4 10*3/uL (ref 0.1–1.0)
Monocytes Relative: 10.8 % (ref 3.0–12.0)
Neutro Abs: 2.3 10*3/uL (ref 1.4–7.7)
Neutrophils Relative %: 63.1 % (ref 43.0–77.0)
Platelets: 225 10*3/uL (ref 150.0–400.0)
RBC: 2.96 Mil/uL — ABNORMAL LOW (ref 3.87–5.11)
RDW: 13.3 % (ref 11.5–15.5)
WBC: 3.6 10*3/uL — ABNORMAL LOW (ref 4.0–10.5)

## 2019-11-02 LAB — LIPID PANEL
Cholesterol: 205 mg/dL — ABNORMAL HIGH (ref 0–200)
HDL: 50.9 mg/dL (ref >39.00)
LDL Cholesterol: 120 mg/dL — ABNORMAL HIGH (ref 0–99)
NonHDL: 154.35
Total CHOL/HDL Ratio: 4
Triglycerides: 174 mg/dL — ABNORMAL HIGH (ref 0.0–149.0)
VLDL: 34.8 mg/dL (ref 0.0–40.0)

## 2019-11-02 LAB — BASIC METABOLIC PANEL
BUN: 25 mg/dL — ABNORMAL HIGH (ref 6–23)
CO2: 27 mEq/L (ref 19–32)
Calcium: 9.5 mg/dL (ref 8.4–10.5)
Chloride: 103 mEq/L (ref 96–112)
Creatinine, Ser: 1 mg/dL (ref 0.40–1.20)
GFR: 52.84 mL/min — ABNORMAL LOW (ref >60.00)
Glucose, Bld: 86 mg/dL (ref 70–99)
Potassium: 3.5 mEq/L (ref 3.5–5.1)
Sodium: 137 mEq/L (ref 135–145)

## 2019-11-02 LAB — HEPATIC FUNCTION PANEL
ALT: 10 U/L (ref 0–35)
AST: 18 U/L (ref 0–37)
Albumin: 4.2 g/dL (ref 3.5–5.2)
Alkaline Phosphatase: 25 U/L — ABNORMAL LOW (ref 39–117)
Bilirubin, Direct: 0.1 mg/dL (ref 0.0–0.3)
Total Bilirubin: 0.4 mg/dL (ref 0.2–1.2)
Total Protein: 7.9 g/dL (ref 6.0–8.3)

## 2019-11-02 LAB — TSH: TSH: 4.59 u[IU]/mL — ABNORMAL HIGH (ref 0.35–4.50)

## 2019-11-02 NOTE — Addendum Note (Signed)
Addended by: Midge Minium on: 11/02/2019 09:13 AM   Modules accepted: Orders

## 2019-11-03 ENCOUNTER — Other Ambulatory Visit: Payer: Self-pay | Admitting: Family Medicine

## 2019-11-03 DIAGNOSIS — E039 Hypothyroidism, unspecified: Secondary | ICD-10-CM

## 2019-11-03 DIAGNOSIS — D649 Anemia, unspecified: Secondary | ICD-10-CM

## 2019-11-07 ENCOUNTER — Other Ambulatory Visit: Payer: Self-pay

## 2019-11-07 ENCOUNTER — Ambulatory Visit
Admission: RE | Admit: 2019-11-07 | Discharge: 2019-11-07 | Disposition: A | Discharge status: Home / Self Care | Payer: Medicare HMO | Source: Ambulatory Visit | Attending: Family Medicine | Admitting: Family Medicine

## 2019-11-07 ENCOUNTER — Ambulatory Visit: Payer: Medicare Other | Attending: Internal Medicine

## 2019-11-07 DIAGNOSIS — Z1231 Encounter for screening mammogram for malignant neoplasm of breast: Secondary | ICD-10-CM | POA: Diagnosis not present

## 2019-11-07 DIAGNOSIS — Z23 Encounter for immunization: Secondary | ICD-10-CM

## 2019-11-07 NOTE — Progress Notes (Signed)
   Covid-19 Vaccination Clinic  Name:  Breanna Yang    MRN: [id number] DOB: Mar 25, 1934  11/07/2019  [name] was observed post Covid-19 immunization for 15 minutes without incidence. She was provided with Vaccine Information Sheet and instruction to access the V-Safe system.   [name] was instructed to call 911 with any severe reactions post vaccine: Marland Kitchen Difficulty breathing  . Swelling of your face and throat  . A fast heartbeat  . A bad rash all over your body  . Dizziness and weakness    Immunizations Administered    Name Date Dose VIS Date Route   Pfizer COVID-19 Vaccine 11/07/2019  3:27 PM 0.3 mL 09/09/2019 Intramuscular   Manufacturer: [place]   Lot: [id number]   [place]: [id number]

## 2019-11-24 ENCOUNTER — Other Ambulatory Visit: Payer: Self-pay

## 2019-11-24 ENCOUNTER — Other Ambulatory Visit (INDEPENDENT_AMBULATORY_CARE_PROVIDER_SITE_OTHER): Payer: Medicare HMO

## 2019-11-24 DIAGNOSIS — D649 Anemia, unspecified: Secondary | ICD-10-CM | POA: Diagnosis not present

## 2019-11-24 LAB — FECAL OCCULT BLOOD, IMMUNOCHEMICAL: Fecal Occult Bld: NEGATIVE

## 2019-12-14 ENCOUNTER — Ambulatory Visit (INDEPENDENT_AMBULATORY_CARE_PROVIDER_SITE_OTHER): Payer: Medicare HMO | Admitting: Family Medicine

## 2019-12-14 ENCOUNTER — Other Ambulatory Visit: Payer: Self-pay

## 2019-12-14 ENCOUNTER — Encounter: Payer: Self-pay | Admitting: Family Medicine

## 2019-12-14 VITALS — BP 130/70 | HR 80 | Temp 98.0°F | Resp 17 | Wt 116.5 lb

## 2019-12-14 DIAGNOSIS — R2681 Unsteadiness on feet: Secondary | ICD-10-CM

## 2019-12-14 DIAGNOSIS — R5383 Other fatigue: Secondary | ICD-10-CM | POA: Diagnosis not present

## 2019-12-14 DIAGNOSIS — F0150 Vascular dementia without behavioral disturbance: Secondary | ICD-10-CM | POA: Diagnosis not present

## 2019-12-14 DIAGNOSIS — R634 Abnormal weight loss: Secondary | ICD-10-CM | POA: Insufficient documentation

## 2019-12-14 LAB — CBC WITH DIFFERENTIAL/PLATELET
Basophils Absolute: 0 10*3/uL (ref 0.0–0.1)
Basophils Relative: 0.3 % (ref 0.0–3.0)
Eosinophils Absolute: 0 10*3/uL (ref 0.0–0.7)
Eosinophils Relative: 0.3 % (ref 0.0–5.0)
HCT: 30 % — ABNORMAL LOW (ref 36.0–46.0)
Hemoglobin: 10.3 g/dL — ABNORMAL LOW (ref 12.0–15.0)
Lymphocytes Relative: 14.6 % (ref 12.0–46.0)
Lymphs Abs: 0.7 10*3/uL (ref 0.7–4.0)
MCHC: 34.2 g/dL (ref 30.0–36.0)
MCV: 100.7 fl — ABNORMAL HIGH (ref 78.0–100.0)
Monocytes Absolute: 0.4 10*3/uL (ref 0.1–1.0)
Monocytes Relative: 8 % (ref 3.0–12.0)
Neutro Abs: 3.9 10*3/uL (ref 1.4–7.7)
Neutrophils Relative %: 76.8 % (ref 43.0–77.0)
Platelets: 236 10*3/uL (ref 150.0–400.0)
RBC: 2.98 Mil/uL — ABNORMAL LOW (ref 3.87–5.11)
RDW: 13.1 % (ref 11.5–15.5)
WBC: 5 10*3/uL (ref 4.0–10.5)

## 2019-12-14 LAB — BASIC METABOLIC PANEL
BUN: 22 mg/dL (ref 6–23)
CO2: 28 mEq/L (ref 19–32)
Calcium: 9.8 mg/dL (ref 8.4–10.5)
Chloride: 102 mEq/L (ref 96–112)
Creatinine, Ser: 0.99 mg/dL (ref 0.40–1.20)
GFR: 53.44 mL/min — ABNORMAL LOW (ref >60.00)
Glucose, Bld: 121 mg/dL — ABNORMAL HIGH (ref 70–99)
Potassium: 3.9 mEq/L (ref 3.5–5.1)
Sodium: 136 mEq/L (ref 135–145)

## 2019-12-14 LAB — B12 AND FOLATE PANEL
Folate: 10.8 ng/mL (ref >5.9)
Vitamin B-12: 830 pg/mL (ref 211–911)

## 2019-12-14 LAB — TSH: TSH: 4.38 u[IU]/mL (ref 0.35–4.50)

## 2019-12-14 LAB — HEPATIC FUNCTION PANEL
ALT: 11 U/L (ref 0–35)
AST: 20 U/L (ref 0–37)
Albumin: 4.1 g/dL (ref 3.5–5.2)
Alkaline Phosphatase: 27 U/L — ABNORMAL LOW (ref 39–117)
Bilirubin, Direct: 0 mg/dL (ref 0.0–0.3)
Total Bilirubin: 0.3 mg/dL (ref 0.2–1.2)
Total Protein: 8 g/dL (ref 6.0–8.3)

## 2019-12-14 NOTE — Patient Instructions (Signed)
Follow up in 3 months to recheck weight We'll notify you of your lab results and make any changes if needed Continue to eat regularly and drink the Ensure twice daily Please make sure you are being careful when you get up and change positions b/c of your balance difficulties Call with any questions or concerns Stay Safe!

## 2019-12-14 NOTE — Progress Notes (Signed)
   Subjective:    Patient ID: Breanna Yang, female    DOB: 1934/08/31, 86 y.o.   MRN: [id number]  HPI Weight loss- pt is down 9 lbs since July.  Pt reports she is eating regularly but daughter reports she is not eating 'enough'.  She has added Ensure + twice daily.    Fatigue- pt had elevated TSH and anemia last month and did not return for f/u labs.  Pt told CMA that she was fatigued and feeling weak but when asked about this, 'i'm never tired'.  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  Then states 'i'm just worried about how I feel'.  No CP, SOB, HAs, visual changes  Balance issues- pt had surgery for brain aneurysm 1985.  Reports she was told that she would 'have trouble now and then'.  Pt reports she is not concerned about her balance or walking sideways (as she did in the office today).  No recent falls.     Review of Systems For ROS see HPI   This visit occurred during the SARS-CoV-2 public health emergency.  Safety protocols were in place, including screening questions prior to the visit, additional usage of staff PPE, and extensive cleaning of exam room while observing appropriate contact time as indicated for disinfecting solutions.       Objective:   Physical Exam Vitals reviewed.  Constitutional:      General: She is not in acute distress.    Appearance: Normal appearance. She is not ill-appearing.  HENT:     Head: Normocephalic and atraumatic.     Right Ear: Tympanic membrane and ear canal normal.     Left Ear: Tympanic membrane and ear canal normal.  Eyes:     Extraocular Movements: Extraocular movements intact.     Pupils: Pupils are equal, round, and reactive to light.  Cardiovascular:     Rate and Rhythm: Normal rate and regular rhythm.     Pulses: Normal pulses.     Heart sounds: Normal heart sounds.  Pulmonary:     Effort: Pulmonary effort is normal. No respiratory distress.     Breath sounds: Normal breath sounds. No wheezing.  Abdominal:     General:  There is no distension.     Palpations: Abdomen is soft.     Tenderness: There is no abdominal tenderness. There is no guarding.  Musculoskeletal:        General: No swelling or deformity.     Cervical back: Normal range of motion. No tenderness.     Right lower leg: No edema.     Left lower leg: No edema.  Lymphadenopathy:     Cervical: No cervical adenopathy.  Skin:    General: Skin is warm and dry.  Neurological:     Mental Status: She is alert.     Gait: Gait abnormal (Pt was staggering and walking sideways when she got up from the chair and was then able to walk fairly normally).     Comments: 1/2 way through visit pt asked who I was.  Would make statements and then almost immediately contradict them           Assessment & Plan:  Fatigue- pt reports she is worried about feeling 'weak' and 'puny' but then almost immediately denies fatigue.  Will check labs to assess for metabolic cause but unable to get better history from patient.

## 2019-12-15 NOTE — Assessment & Plan Note (Signed)
Ongoing issue for pt since her aneurysm.  She reports she has regular PT but I'm not sure if that is an accurate statement b/c this was last ordered in 2018.  She has a neurologist- Dr Delice Lesch- but she has not been seen recently.  Pt doesn't seem concerned or phased by her gait issues but I'm concerned for increased fall risk.

## 2019-12-15 NOTE — Assessment & Plan Note (Signed)
Pt is down 9 lbs since July.  She states she is eating regularly but family doesn't think she is eating enough.  She has added Ensure+ twice daily.  Told her this was a good idea.  Will check labs to assess electrolytes and TSH.  Encouraged her to eat frequently throughout the day rather than 3 large meals.  At this point, i'm not sure if pt is remembering to eat during the day when she is home alone.

## 2019-12-15 NOTE — Assessment & Plan Note (Signed)
Deteriorated.  Pt had a lot more difficulty w/ memory and thought process today.  She was not aware of who I was and when I re-introduced myself she told me 'you don't look like her'.  I have concerns that pt is living alone and will attempt to call family and discuss this as she is here today by herself (elderly sister drove and is waiting in the parking lot).  She appears to need more assistance than previously and it may be time to consider alternative living arrangements.

## 2019-12-20 ENCOUNTER — Other Ambulatory Visit: Payer: Self-pay | Admitting: General Practice

## 2019-12-20 ENCOUNTER — Other Ambulatory Visit: Payer: Self-pay

## 2019-12-20 ENCOUNTER — Other Ambulatory Visit: Payer: Self-pay | Admitting: Family Medicine

## 2019-12-20 DIAGNOSIS — F0150 Vascular dementia without behavioral disturbance: Secondary | ICD-10-CM

## 2019-12-20 NOTE — Patient Outreach (Addendum)
[facility]) Care Management  12/20/2019  Breanna Yang [birth year] [id number]   Telephone Screen  Referral Date:12/20/2019 Referral Source: MD Office Referral Reason: "vascular dementia" Insurance: [facility]   Outreach attempt # 1 to patient. Spoke with daughter/POA(Dawn). Discussed and reviewed referral source and reason. She shares that her mother has had dementia for a little while. However, she feels that it is progressing. Patient has become 'resentful and volatile' towards her. She lives alone but has family that lives nearby and checks on her. Daughter works during the day.  Daughter concerned about patient continuing to live alone. She is wanting to move patient in to live with her but patient resistant. Caregiver looking for in home support/assistance to help with her mother. She reports income is too high for patient to be eligible for Medicaid. Daughter states she spoke with someone at [facility] and was told patient was eligible for in home services through them. Advised caregiver that Medicare does not normally pay for these services. She will call [employer] back to get further clarification on what services patient qualifies for. She does not want patient to be placed at ALF/SNF at this time. Discussed [facility] SW referral for in home support resources and daughter is in agreement. She reports that patient is able to bathe and dress herself. She has prepared meals at times but then left the stove on so she does not like for patient to cook. She is unsure regarding how patient manages meds. However, she voices that she does not feel like patient is doing it correctly. Discussed pharmacy referral and caregiver in agreement. She denies any RN CM needs or concerns at this time.      Plan: RN CM will send [facility] SW and pharmacy referral for further assistance with above issues.    Enzo Montgomery, RN,BSN,CCM [place] [facility] Telephonic Care Management  Coordinator Direct Phone: [phone] Toll Free: [phone] Fax: [phone]

## 2019-12-25 ENCOUNTER — Other Ambulatory Visit: Payer: Self-pay | Admitting: Family Medicine

## 2020-01-06 ENCOUNTER — Telehealth: Payer: Self-pay | Admitting: Family Medicine

## 2020-01-06 NOTE — Telephone Encounter (Signed)
Dawn - [phone] (cell)             [phone] (wk)  Pt daughter needs advise - she will come to an appt with her mom so everyone can discuss good options since the dementia is getting worse.  But she needs advise on what the power of attorney covers and what steps she can take to get things done ( for instance, having power to do things for her mom at the bank ).  She is concerned because the dementia is getting worse.    Please call and touch base with her and she will get approval to come to her next appt so things can be discussed with both of them in the same room.

## 2020-01-06 NOTE — Telephone Encounter (Signed)
We can refer pt for neuropsych testing as this will give an objective assessment of her mental capacity.  If she has a power of attorney listed/designated/assigned that person is able to pay bills, do banking, etc.  This is something to discuss with the [facility] social worker, NOT the pharmacist.

## 2020-01-06 NOTE — Telephone Encounter (Signed)
Patient daughter notified of PCP recommendations and is agreement and expresses an understanding.    

## 2020-01-06 NOTE — Telephone Encounter (Signed)
Pt daughter stated that they were advised with the [facility] referral to speak with pharmacist. She is wanting to know how to get more information in regards to mom's mental capacity and her day to day functions and things that would most likely have been covered with mental health power of attorney.

## 2020-01-09 ENCOUNTER — Telehealth: Payer: Self-pay | Admitting: Family Medicine

## 2020-01-09 NOTE — Progress Notes (Signed)
  Chronic Care Management   Note  01/09/2020 Name: [name] MRN: [id number] DOB: [birth year]  Breanna Yang is a 86 y.o. year old female who is a primary care patient of Birdie Riddle, Aundra Millet, MD. I reached out to [employer] by phone today in response to a referral sent by Ms. [name]'s PCP, Midge Minium, MD.   [name] was given information about Chronic Care Management services today including:  1. CCM service includes personalized support from designated clinical staff supervised by her physician, including individualized plan of care and coordination with other care providers 2. 24/7 contact phone numbers for assistance for urgent and routine care needs. 3. Service will only be billed when office clinical staff spend 20 minutes or more in a month to coordinate care. 4. Only one practitioner may furnish and bill the service in a calendar month. 5. The patient may stop CCM services at any time (effective at the end of the month) by phone call to the office staff.   Patient did not agree to services and wishes to consider information provided before deciding about enrollment in care management services.   Follow up plan:   Earney [place] Upstream Scheduler

## 2020-01-10 ENCOUNTER — Telehealth: Payer: Self-pay | Admitting: *Deleted

## 2020-01-10 NOTE — Patient Outreach (Signed)
[facility]) Care Management  01/10/2020  Doriana Garretson Bober 29-Dec-1933 [id number]   CSW received referral from [place] that patient has dementia and lives alone. Daughter/POA(Dawn) looking for resources/assistance to provide in homesupport/care to patient. Patient does not qualify for Medicaid. CSW called & spoke with patient's daughter, [name] who states that she is concerned about patient living alone and is trying to talk her into moving in with her & her husband but patient is insistent on staying home, has been living alone since 2014 when her husband passed. Patient has dementia and daughter states that patient has left the stove on, she's unsteady on her feet, thankfully she doesn't drive. Patient's daughter states that she has an appointment coming up in June that she plans to attend with her mother and is hoping that by then she will give in to moving but if not CSW provided Dawn with the phone number to [place] (ph#: [phone]) but daughter states that she would really like to hold off on calling as she doesn't want to upset her mother and make her feel like she is being forced out of her home. CSW emailed resources to patient's daughter as well (email: [email or web address]) for in-home assistance should they need it incase patient continues to stay in her own home. CSW encouraged Dawn to reach out for additional support or resources if needed but no further CSW needs identified at this time. CSW will close case.    Raynaldo Opitz, LCSW [facility]  Clinical Social Worker cell #: [phone]

## 2020-02-03 DIAGNOSIS — L821 Other seborrheic keratosis: Secondary | ICD-10-CM | POA: Diagnosis not present

## 2020-02-03 DIAGNOSIS — L814 Other melanin hyperpigmentation: Secondary | ICD-10-CM | POA: Diagnosis not present

## 2020-02-03 DIAGNOSIS — C44712 Basal cell carcinoma of skin of right lower limb, including hip: Secondary | ICD-10-CM | POA: Diagnosis not present

## 2020-02-03 DIAGNOSIS — D485 Neoplasm of uncertain behavior of skin: Secondary | ICD-10-CM | POA: Diagnosis not present

## 2020-02-06 ENCOUNTER — Other Ambulatory Visit: Payer: Self-pay | Admitting: Family Medicine

## 2020-02-13 DIAGNOSIS — C44712 Basal cell carcinoma of skin of right lower limb, including hip: Secondary | ICD-10-CM | POA: Diagnosis not present

## 2020-03-07 DIAGNOSIS — Z85828 Personal history of other malignant neoplasm of skin: Secondary | ICD-10-CM | POA: Diagnosis not present

## 2020-03-07 DIAGNOSIS — L218 Other seborrheic dermatitis: Secondary | ICD-10-CM | POA: Diagnosis not present

## 2020-03-07 DIAGNOSIS — D1801 Hemangioma of skin and subcutaneous tissue: Secondary | ICD-10-CM | POA: Diagnosis not present

## 2020-03-07 DIAGNOSIS — D485 Neoplasm of uncertain behavior of skin: Secondary | ICD-10-CM | POA: Diagnosis not present

## 2020-03-07 DIAGNOSIS — L821 Other seborrheic keratosis: Secondary | ICD-10-CM | POA: Diagnosis not present

## 2020-03-07 DIAGNOSIS — L905 Scar conditions and fibrosis of skin: Secondary | ICD-10-CM | POA: Diagnosis not present

## 2020-03-07 DIAGNOSIS — D225 Melanocytic nevi of trunk: Secondary | ICD-10-CM | POA: Diagnosis not present

## 2020-03-07 DIAGNOSIS — C44519 Basal cell carcinoma of skin of other part of trunk: Secondary | ICD-10-CM | POA: Diagnosis not present

## 2020-03-07 DIAGNOSIS — L570 Actinic keratosis: Secondary | ICD-10-CM | POA: Diagnosis not present

## 2020-03-13 ENCOUNTER — Ambulatory Visit (INDEPENDENT_AMBULATORY_CARE_PROVIDER_SITE_OTHER): Payer: Medicare HMO | Admitting: Family Medicine

## 2020-03-13 ENCOUNTER — Encounter: Payer: Self-pay | Admitting: Family Medicine

## 2020-03-13 ENCOUNTER — Other Ambulatory Visit: Payer: Self-pay

## 2020-03-13 VITALS — BP 110/69 | HR 71 | Temp 97.8°F | Resp 16 | Ht 65.0 in | Wt 119.4 lb

## 2020-03-13 DIAGNOSIS — F0150 Vascular dementia without behavioral disturbance: Secondary | ICD-10-CM | POA: Diagnosis not present

## 2020-03-13 DIAGNOSIS — R634 Abnormal weight loss: Secondary | ICD-10-CM

## 2020-03-13 DIAGNOSIS — R2681 Unsteadiness on feet: Secondary | ICD-10-CM | POA: Diagnosis not present

## 2020-03-13 DIAGNOSIS — Z681 Body mass index (BMI) 19 or less, adult: Secondary | ICD-10-CM | POA: Diagnosis not present

## 2020-03-13 NOTE — Patient Instructions (Addendum)
Follow up as scheduled in August Continue to drink your Ensure regularly and eat 3 meals/day Please reconsider home health physical therapy to help w/ balance and strength Your daughter is ready for you to move in, all you have to do is say yes!  And let other people handle the rest! Call with any questions or concerns Have a great summer!

## 2020-03-13 NOTE — Assessment & Plan Note (Signed)
Weight has stabilized and pt has actually gained 2.5 lbs.  Encouraged her to continue her Ensure and to eat at least 3x/day.  Will follow.

## 2020-03-13 NOTE — Assessment & Plan Note (Signed)
Pt's daughter has been trying to get her to move in x2 yrs.  Had long, frank discussion w/ pt and daughter today that it was not safe for her to live alone.  She relies on her elderly sister to drive her, feed her.  Told her that very soon her sister will need help of her own and she will need other forms of assistance.  Pt reports she plans to move in but won't commit to when.  Encouraged daughter to call a realtor on pt's behalf.  Will follow.

## 2020-03-13 NOTE — Progress Notes (Signed)
   Subjective:    Patient ID: Breanna Yang, female    DOB: [birth year], 86 y.o.   MRN: [id number]  HPI Weight loss- pt has gained 2 lbs since last visit.  Pt is now drinking Ensure 2x/day.  Daughter reports she eats 'very well' when they go out but isn't sure how she is eating during the week.  Dementia- pt is planning to move in w/ daughter, 'I don't know exactly when but I think it will be this year'.  Daughter reports she has been trying to get mom to move in x2 yrs.  Gait instability- daughter is worried about high fall risk.  This was discussed at last visit and pt feels she is doing 'fine' and doesn't require PT or intervention.  Pt had a life alert but refused to wear it   Review of Systems For ROS see HPI   This visit occurred during the SARS-CoV-2 public health emergency.  Safety protocols were in place, including screening questions prior to the visit, additional usage of staff PPE, and extensive cleaning of exam room while observing appropriate contact time as indicated for disinfecting solutions.       Objective:   Physical Exam Vitals reviewed.  Constitutional:      General: She is not in acute distress.    Appearance: Normal appearance. She is well-developed. She is not ill-appearing.  HENT:     Head: Normocephalic and atraumatic.  Eyes:     Conjunctiva/sclera: Conjunctivae normal.     Pupils: Pupils are equal, round, and reactive to light.  Neck:     Thyroid: No thyromegaly.  Cardiovascular:     Rate and Rhythm: Normal rate and regular rhythm.     Heart sounds: Normal heart sounds. No murmur heard.   Pulmonary:     Effort: Pulmonary effort is normal. No respiratory distress.     Breath sounds: Normal breath sounds.  Abdominal:     General: There is no distension.     Palpations: Abdomen is soft.     Tenderness: There is no abdominal tenderness.  Musculoskeletal:     Cervical back: Normal range of motion and neck supple.  Lymphadenopathy:     Cervical: No  cervical adenopathy.  Skin:    General: Skin is warm and dry.  Neurological:     Mental Status: She is alert. Mental status is at baseline.  Psychiatric:        Behavior: Behavior normal.           Assessment & Plan:

## 2020-03-13 NOTE — Assessment & Plan Note (Signed)
Continues to be a problem for pt but she is refusing HH PT.  Daughter also voiced her concerns.  We cited this as yet another reason pt needs to move in w/ daughter to have supervision and assistance.

## 2020-03-26 DIAGNOSIS — L218 Other seborrheic dermatitis: Secondary | ICD-10-CM | POA: Diagnosis not present

## 2020-04-05 ENCOUNTER — Other Ambulatory Visit: Payer: Self-pay | Admitting: Family Medicine

## 2020-04-10 ENCOUNTER — Other Ambulatory Visit: Payer: Self-pay | Admitting: Family Medicine

## 2020-04-17 DIAGNOSIS — H5213 Myopia, bilateral: Secondary | ICD-10-CM | POA: Diagnosis not present

## 2020-04-17 DIAGNOSIS — Z961 Presence of intraocular lens: Secondary | ICD-10-CM | POA: Diagnosis not present

## 2020-04-17 DIAGNOSIS — H524 Presbyopia: Secondary | ICD-10-CM | POA: Diagnosis not present

## 2020-04-17 DIAGNOSIS — H52203 Unspecified astigmatism, bilateral: Secondary | ICD-10-CM | POA: Diagnosis not present

## 2020-04-17 DIAGNOSIS — Z0100 Encounter for examination of eyes and vision without abnormal findings: Secondary | ICD-10-CM | POA: Diagnosis not present

## 2020-04-25 DIAGNOSIS — C44519 Basal cell carcinoma of skin of other part of trunk: Secondary | ICD-10-CM | POA: Diagnosis not present

## 2020-04-30 ENCOUNTER — Telehealth: Payer: Self-pay | Admitting: Family Medicine

## 2020-04-30 NOTE — Telephone Encounter (Signed)
Please advise 

## 2020-04-30 NOTE — Telephone Encounter (Signed)
Noted  

## 2020-04-30 NOTE — Telephone Encounter (Signed)
Spoke with daughter she states that pt is refusing to move out of her house. She states that she has left the stove on a couple of time as well as fallen down. She said at the last appt she feels like what [name] said went in one ear and out of the other. Pt has an appt tomorrow.

## 2020-04-30 NOTE — Telephone Encounter (Signed)
Will discuss at visit tomorrow

## 2020-05-01 ENCOUNTER — Ambulatory Visit: Payer: Medicare HMO

## 2020-05-01 ENCOUNTER — Encounter: Payer: Self-pay | Admitting: Family Medicine

## 2020-05-01 ENCOUNTER — Ambulatory Visit (INDEPENDENT_AMBULATORY_CARE_PROVIDER_SITE_OTHER): Payer: Medicare HMO | Admitting: Family Medicine

## 2020-05-01 ENCOUNTER — Other Ambulatory Visit: Payer: Self-pay

## 2020-05-01 VITALS — BP 122/72 | HR 90 | Temp 97.9°F | Resp 16 | Ht 65.0 in | Wt 121.2 lb

## 2020-05-01 DIAGNOSIS — M810 Age-related osteoporosis without current pathological fracture: Secondary | ICD-10-CM

## 2020-05-01 DIAGNOSIS — I10 Essential (primary) hypertension: Secondary | ICD-10-CM

## 2020-05-01 DIAGNOSIS — Z0000 Encounter for general adult medical examination without abnormal findings: Secondary | ICD-10-CM

## 2020-05-01 LAB — CBC WITH DIFFERENTIAL/PLATELET
Basophils Absolute: 0 10*3/uL (ref 0.0–0.1)
Basophils Relative: 1.1 % (ref 0.0–3.0)
Eosinophils Absolute: 0.1 10*3/uL (ref 0.0–0.7)
Eosinophils Relative: 1.8 % (ref 0.0–5.0)
HCT: 30 % — ABNORMAL LOW (ref 36.0–46.0)
Hemoglobin: 10.2 g/dL — ABNORMAL LOW (ref 12.0–15.0)
Lymphocytes Relative: 22.6 % (ref 12.0–46.0)
Lymphs Abs: 0.8 10*3/uL (ref 0.7–4.0)
MCHC: 34 g/dL (ref 30.0–36.0)
MCV: 98.8 fl (ref 78.0–100.0)
Monocytes Absolute: 0.4 10*3/uL (ref 0.1–1.0)
Monocytes Relative: 11 % (ref 3.0–12.0)
Neutro Abs: 2.3 10*3/uL (ref 1.4–7.7)
Neutrophils Relative %: 63.5 % (ref 43.0–77.0)
Platelets: 218 10*3/uL (ref 150.0–400.0)
RBC: 3.03 Mil/uL — ABNORMAL LOW (ref 3.87–5.11)
RDW: 13.3 % (ref 11.5–15.5)
WBC: 3.6 10*3/uL — ABNORMAL LOW (ref 4.0–10.5)

## 2020-05-01 LAB — HEPATIC FUNCTION PANEL
ALT: 12 U/L (ref 0–35)
AST: 20 U/L (ref 0–37)
Albumin: 4.2 g/dL (ref 3.5–5.2)
Alkaline Phosphatase: 23 U/L — ABNORMAL LOW (ref 39–117)
Bilirubin, Direct: 0.1 mg/dL (ref 0.0–0.3)
Total Bilirubin: 0.3 mg/dL (ref 0.2–1.2)
Total Protein: 8.2 g/dL (ref 6.0–8.3)

## 2020-05-01 LAB — BASIC METABOLIC PANEL
BUN: 26 mg/dL — ABNORMAL HIGH (ref 6–23)
CO2: 28 mEq/L (ref 19–32)
Calcium: 9.7 mg/dL (ref 8.4–10.5)
Chloride: 103 mEq/L (ref 96–112)
Creatinine, Ser: 1 mg/dL (ref 0.40–1.20)
GFR: 52.78 mL/min — ABNORMAL LOW (ref >60.00)
Glucose, Bld: 89 mg/dL (ref 70–99)
Potassium: 4 mEq/L (ref 3.5–5.1)
Sodium: 136 mEq/L (ref 135–145)

## 2020-05-01 LAB — VITAMIN D 25 HYDROXY (VIT D DEFICIENCY, FRACTURES): VITD: 20.54 ng/mL — ABNORMAL LOW (ref 30.00–100.00)

## 2020-05-01 LAB — LIPID PANEL
Cholesterol: 186 mg/dL (ref 0–200)
HDL: 53.3 mg/dL (ref >39.00)
LDL Cholesterol: 94 mg/dL (ref 0–99)
NonHDL: 132.42
Total CHOL/HDL Ratio: 3
Triglycerides: 194 mg/dL — ABNORMAL HIGH (ref 0.0–149.0)
VLDL: 38.8 mg/dL (ref 0.0–40.0)

## 2020-05-01 LAB — TSH: TSH: 4.05 u[IU]/mL (ref 0.35–4.50)

## 2020-05-01 NOTE — Patient Instructions (Addendum)
Follow up in 6 months to recheck BP and cholesterol (sooner if needed) We'll notify you of your lab results and make any changes if needed Please consider moving in with Ms Webb Silversmith or your daughter.  This is for your safety Call with any questions or concerns Stay Safe!  Stay Healthy!

## 2020-05-01 NOTE — Assessment & Plan Note (Signed)
Chronic problem.  Check Vit D and replete prn. 

## 2020-05-01 NOTE — Progress Notes (Signed)
   Subjective:    Patient ID: Breanna Yang, female    DOB: 07/11/1934, 86 y.o.   MRN: [id number]  HPI CPE- pt denies concerns today.  UTD on immunizations, mammo.  Reviewed past medical, surgical, family and social histories.   Patient Care Team    Relationship Specialty Notifications Start End  Midge Minium, MD PCP - General Family Medicine  07/04/16   Hurley Cisco, MD Consulting Physician Rheumatology  07/04/16   Druscilla Brownie, MD Consulting Physician Dermatology  07/04/16   Rutherford Guys, MD Consulting Physician Ophthalmology  07/04/16      Health Maintenance  Topic Date Due  . INFLUENZA VACCINE  04/29/2020  . TETANUS/TDAP  05/01/2021 (Originally 06/29/2016)  . DEXA SCAN  Completed  . COVID-19 Vaccine  Completed  . PNA vac Low Risk Adult  Completed     Review of Systems Patient reports no vision/ hearing changes, adenopathy,fever, weight change,  persistant/recurrent hoarseness , swallowing issues, chest pain, palpitations, edema, persistant/recurrent cough, hemoptysis, dyspnea (rest/exertional/paroxysmal nocturnal), gastrointestinal bleeding (melena, rectal bleeding), abdominal pain, significant heartburn, bowel changes, GU symptoms (dysuria, hematuria, incontinence), Gyn symptoms (abnormal  bleeding, pain),  syncope, focal weakness, numbness & tingling, skin/hair/nail changes, abnormal bruising or bleeding, anxiety, or depression.  + memory issues  This visit occurred during the SARS-CoV-2 public health emergency.  Safety protocols were in place, including screening questions prior to the visit, additional usage of staff PPE, and extensive cleaning of exam room while observing appropriate contact time as indicated for disinfecting solutions.       Objective:   Physical Exam General Appearance:    Alert, cooperative, no distress, appears stated age  Head:    Normocephalic, without obvious abnormality, atraumatic  Eyes:    PERRL, conjunctiva/corneas clear, EOM's  intact, fundi    benign, both eyes  Ears:    Normal TM's and external ear canals, both ears  Nose:   Deferred due to COVID  Throat:   Neck:   Supple, symmetrical, trachea midline, no adenopathy;    Thyroid: no enlargement/tenderness/nodules  Back:     Symmetric, no curvature, ROM normal, no CVA tenderness  Lungs:     Clear to auscultation bilaterally, respirations unlabored  Chest Wall:    No tenderness or deformity   Heart:    Regular rate and rhythm, S1 and S2 normal, no murmur, rub   or gallop  Breast Exam:    Deferred to mammo  Abdomen:     Soft, non-tender, bowel sounds active all four quadrants,    no masses, no organomegaly  Genitalia:    Deferred  Rectal:    Extremities:   Extremities normal, atraumatic, no cyanosis or edema  Pulses:   2+ and symmetric all extremities  Skin:   Skin color, texture, turgor normal, no rashes or lesions  Lymph nodes:   Cervical, supraclavicular, and axillary nodes normal  Neurologic:   CNII-XII intact, normal strength, sensation and reflexes    throughout          Assessment & Plan:

## 2020-05-01 NOTE — Assessment & Plan Note (Signed)
Pt's PE WNL.  UTD on mammo, immunizations.  No longer having colon cancer screening.  Check labs.  Anticipatory guidance provided.

## 2020-05-01 NOTE — Assessment & Plan Note (Signed)
Chronic problem.  Well controlled.  Asymptomatic.  Will follow.

## 2020-05-02 ENCOUNTER — Other Ambulatory Visit: Payer: Self-pay | Admitting: General Practice

## 2020-05-02 MED ORDER — VITAMIN D (ERGOCALCIFEROL) 1.25 MG (50000 UNIT) PO CAPS: 50000.0000 [IU] | ORAL_CAPSULE | ORAL | 0 refills | Status: DC

## 2020-05-03 ENCOUNTER — Other Ambulatory Visit: Payer: Self-pay | Admitting: Family Medicine

## 2020-05-04 ENCOUNTER — Other Ambulatory Visit: Payer: Self-pay | Admitting: Family Medicine

## 2020-05-10 ENCOUNTER — Other Ambulatory Visit: Payer: Self-pay | Admitting: Family Medicine

## 2020-06-06 DIAGNOSIS — L905 Scar conditions and fibrosis of skin: Secondary | ICD-10-CM | POA: Diagnosis not present

## 2020-06-06 DIAGNOSIS — L821 Other seborrheic keratosis: Secondary | ICD-10-CM | POA: Diagnosis not present

## 2020-06-06 DIAGNOSIS — L570 Actinic keratosis: Secondary | ICD-10-CM | POA: Diagnosis not present

## 2020-06-06 DIAGNOSIS — Z85828 Personal history of other malignant neoplasm of skin: Secondary | ICD-10-CM | POA: Diagnosis not present

## 2020-06-14 ENCOUNTER — Ambulatory Visit (INDEPENDENT_AMBULATORY_CARE_PROVIDER_SITE_OTHER): Payer: Medicare HMO

## 2020-06-14 ENCOUNTER — Other Ambulatory Visit: Payer: Self-pay

## 2020-06-14 DIAGNOSIS — Z23 Encounter for immunization: Secondary | ICD-10-CM | POA: Diagnosis not present

## 2020-06-20 ENCOUNTER — Other Ambulatory Visit: Payer: Self-pay | Admitting: Family Medicine

## 2020-07-18 ENCOUNTER — Other Ambulatory Visit: Payer: Self-pay | Admitting: Family Medicine

## 2020-07-20 ENCOUNTER — Other Ambulatory Visit: Payer: Self-pay | Admitting: Family Medicine

## 2020-09-13 ENCOUNTER — Other Ambulatory Visit: Payer: Self-pay | Admitting: Family Medicine

## 2020-09-18 ENCOUNTER — Telehealth: Payer: Self-pay | Admitting: Family Medicine

## 2020-09-18 NOTE — Telephone Encounter (Signed)
Patients daughter picked up medicine and she wants to know if she should take cimbastatin and fenofibrate -

## 2020-11-08 ENCOUNTER — Other Ambulatory Visit: Payer: Self-pay | Admitting: Family Medicine

## 2020-11-08 DIAGNOSIS — Z1231 Encounter for screening mammogram for malignant neoplasm of breast: Secondary | ICD-10-CM

## 2020-11-26 ENCOUNTER — Other Ambulatory Visit: Payer: Self-pay | Admitting: Family Medicine

## 2020-12-31 ENCOUNTER — Ambulatory Visit
Admission: RE | Admit: 2020-12-31 | Discharge: 2020-12-31 | Disposition: A | Discharge status: Home / Self Care | Payer: Medicare HMO | Source: Ambulatory Visit | Attending: Family Medicine | Admitting: Family Medicine

## 2020-12-31 ENCOUNTER — Other Ambulatory Visit: Payer: Self-pay

## 2020-12-31 DIAGNOSIS — Z1231 Encounter for screening mammogram for malignant neoplasm of breast: Secondary | ICD-10-CM

## 2021-01-02 ENCOUNTER — Other Ambulatory Visit: Payer: Self-pay | Admitting: Family Medicine

## 2021-01-02 DIAGNOSIS — R928 Other abnormal and inconclusive findings on diagnostic imaging of breast: Secondary | ICD-10-CM

## 2021-01-18 NOTE — Progress Notes (Signed)
Subjective:   Breanna Yang is a 87 y.o. female who presents for Medicare Annual (Subsequent) preventive examination.  Review of Systems     Cardiac Risk Factors include: advanced age (>56men, >74 women);hypertension;dyslipidemia     Objective:    Today's Vitals   01/21/21 1251  BP: 122/60  Pulse: 70  Resp: 16  Temp: 98 F (36.7 C)  TempSrc: Temporal  SpO2: 98%  Weight: 125 lb (56.7 kg)  Height: 5\' 5"  (1.651 m)   Body mass index is 20.8 kg/m.  Advanced Directives 01/21/2021 01/15/2017 11/17/2016 11/05/2016 10/03/2014  Does Patient Have a Medical Advance Directive? Yes No - No No  Type of Paramedic of [place];Living will - - - -  Copy of [place] in Chart? No - copy requested - - - -  Would patient like information on creating a medical advance directive? - Yes (MAU/Ambulatory/Procedural Areas - Information given) No - Patient declined No - Patient declined No - patient declined information    Current Medications (verified) Outpatient Encounter Medications as of 01/21/2021  Medication Sig  . aspirin 81 MG tablet Take 81 mg by mouth daily.  . Cholecalciferol (VITAMIN D) 2000 units tablet Take 2,000 Units by mouth daily.  . DERMA-SMOOTHE/FS SCALP 0.01 % OIL Apply 1 a small amount to affected area as directed  Apply to scalp leave in qhs, wash out in the am  . ENSURE (ENSURE) Take 237 mLs by mouth at bedtime.  . fenofibrate 160 MG tablet TAKE 1 TABLET BY MOUTH EVERY DAY  . ketoconazole (NIZORAL) 2 % shampoo Shampoo with  a small amount as directed three times a week  . metoprolol succinate (TOPROL-XL) 50 MG 24 hr tablet TAKE 1 TABLET BY MOUTH EVERY DAY WITH OR IMMEDIATELY FOLLOWING A MEAL  . Multiple Vitamin (MULTIVITAMIN) capsule Take 1 capsule by mouth daily.  Marland Kitchen PARoxetine (PAXIL) 20 MG tablet TAKE 1 TABLET BY MOUTH EVERY DAY  . simvastatin (ZOCOR) 40 MG tablet TAKE 1 TABLET BY MOUTH EVERY EVENING AT 6PM  . vitamin B-12  (CYANOCOBALAMIN) 500 MCG tablet Take 500 mcg by mouth daily.  . Vitamin D, Ergocalciferol, (DRISDOL) 1.25 MG (50000 UNIT) CAPS capsule Take 1 capsule (50,000 Units total) by mouth every 7 (seven) days.   No facility-administered encounter medications on file as of 01/21/2021.    Allergies (verified) Patient has no known allergies.   History: Past Medical History:  Diagnosis Date  . Depression   . GERD (gastroesophageal reflux disease)   . Hyperlipidemia   . Hypertension   . Osteoarthritis   . Osteoporosis    Past Surgical History:  Procedure Laterality Date  . BRAIN SURGERY  1985   anneurysm   Family History  Problem Relation Age of Onset  . Cancer Sister    Social History   Socioeconomic History  . Marital status: Widowed    Spouse name: Not on file  . Number of children: 3  . Years of education: some college  . Highest education level: Not on file  Occupational History  . Not on file  Tobacco Use  . Smoking status: Never Smoker  . Smokeless tobacco: Never Used  Vaping Use  . Vaping Use: Never used  Substance and Sexual Activity  . Alcohol use: No  . Drug use: No  . Sexual activity: Not on file  Other Topics Concern  . Not on file  Social History Narrative   Patient lives with daughter in a one  story home.  Has 2 adopted children.  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  Education: some college.    Social Determinants of Health   Financial Resource Strain: Low Risk   . Difficulty of Paying Living Expenses: Not hard at all  Food Insecurity: No Food Insecurity  . Worried About Charity fundraiser in the Last Year: Never true  . Ran Out of Food in the Last Year: Never true  Transportation Needs: No Transportation Needs  . Lack of Transportation (Medical): No  . Lack of Transportation (Non-Medical): No  Physical Activity: Insufficiently Active  . Days of Exercise per Week: 7 days  . Minutes of Exercise per Session: 20 min  Stress: No Stress Concern Present   . Feeling of Stress : Not at all  Social Connections: Moderately Isolated  . Frequency of Communication with Friends and Family: More than three times a week  . Frequency of Social Gatherings with Friends and Family: More than three times a week  . Attends Religious Services: More than 4 times per year  . Active Member of Clubs or Organizations: No  . Attends Archivist Meetings: Never  . Marital Status: Widowed    Tobacco Counseling Counseling given: Not Answered   Clinical Intake:  Pre-visit preparation completed: Yes  Pain : No/denies pain     Nutritional Status: BMI of 19-24  Normal Nutritional Risks: None Diabetes: No  How often do you need to have someone help you when you read instructions, pamphlets, or other written materials from your doctor or pharmacy?: 1 - Never  Diabetic?No  Interpreter Needed?: No  Information entered by :: Caroleen Hamman LPN   Activities of Daily Living In your present state of health, do you have any difficulty performing the following activities: 01/21/2021 05/01/2020  Hearing? N Y  Vision? N N  Difficulty concentrating or making decisions? N Y  Walking or climbing stairs? N N  Dressing or bathing? N N  Doing errands, shopping? N Y  Conservation officer, nature and eating ? N -  Using the Toilet? N -  In the past six months, have you accidently leaked urine? N -  Do you have problems with loss of bowel control? N -  Managing your Medications? N -  Managing your Finances? N -  Housekeeping or managing your Housekeeping? N -  Some recent data might be hidden    Patient Care Team: Midge Minium, MD as PCP - General (Family Medicine) Hurley Cisco, MD as Consulting Physician (Rheumatology) Druscilla Brownie, MD as Consulting Physician (Dermatology) Rutherford Guys, MD as Consulting Physician (Ophthalmology)  Indicate any recent Medical Services you may have received from other than [facility] providers in the past year (date may be  approximate).     Assessment:   This is a routine wellness examination for Breanna Yang.  Hearing/Vision screen  Hearing Screening   125Hz  250Hz  500Hz  1000Hz  2000Hz  3000Hz  4000Hz  6000Hz  8000Hz   Right ear:           Left ear:           Comments: Patient does have some hearing aids  Vision Screening Comments: Wears glasses Last eye exam-11/2020-Dr. Gershon Crane  Dietary issues and exercise activities discussed: Current Exercise Habits: Home exercise routine, Type of exercise: walking, Time (Minutes): 20, Frequency (Times/Week): 5, Weekly Exercise (Minutes/Week): 100, Intensity: Mild, Exercise limited by: None identified  Goals      Patient Stated   .  patient (pt-stated)      Maintain current  health by staying active.       Depression Screen PHQ 2/9 Scores 01/21/2021 05/01/2020 03/13/2020 12/14/2019 10/31/2019 04/29/2019 02/10/2018  PHQ - 2 Score 0 0 0 0 0 0 0  PHQ- 9 Score - 0 0 - 0 0 0    Fall Risk Fall Risk  01/21/2021 05/01/2020 03/13/2020 12/14/2019 10/31/2019  Falls in the past year? 0 0 0 0 0  Number falls in past yr: 0 0 0 0 0  Injury with Fall? 0 0 0 0 0  Risk for fall due to : - - Impaired balance/gait;Impaired mobility Impaired balance/gait;Impaired mobility -  Follow up Falls prevention discussed Falls evaluation completed Falls evaluation completed Falls evaluation completed Falls evaluation completed    FALL RISK PREVENTION PERTAINING TO THE HOME:  Any stairs in or around the home? Yes  If so, are there any without handrails? No  Home free of loose throw rugs in walkways, pet beds, electrical cords, etc? Yes  Adequate lighting in your home to reduce risk of falls? Yes   ASSISTIVE DEVICES UTILIZED TO PREVENT FALLS:  Life alert? No  Use of a cane, walker or w/c? No  Grab bars in the bathroom? Yes  Shower chair or bench in shower? No  Elevated toilet seat or a handicapped toilet? No   TIMED UP AND GO:  Was the test performed? Yes .  Length of time to ambulate 10 feet: 11 sec.    Gait slow and steady without use of assistive device  Cognitive Function:Patient has a previous diagnosis of vascular dementia. MMSE - Mini Mental State Exam 04/06/2017 01/15/2017  Orientation to time 5 5  Orientation to Place 5 5  Registration 3 3  Attention/ Calculation 4 4  Recall 1 2  Language- name 2 objects 2 2  Language- repeat 1 1  Language- follow 3 step command 3 3  Language- read & follow direction 1 1  Write a sentence 1 1  Copy design 1 0  Total score 27 27   Montreal Cognitive Assessment  12/03/2016  Visuospatial/ Executive (0/5) 5  Naming (0/3) 2  Attention: Read list of digits (0/2) 2  Attention: Read list of letters (0/1) 1  Attention: Serial 7 subtraction starting at 100 (0/3) 2  Language: Repeat phrase (0/2) 2  Language : Fluency (0/1) 0  Abstraction (0/2) 1  Delayed Recall (0/5) 4  Orientation (0/6) 6  Total 25   6CIT Screen 01/21/2021  What Year? 4 points  What month? 0 points  What time? 0 points  Count back from 20 0 points  Months in reverse 4 points  Repeat phrase 10 points  Total Score 18    Immunizations Immunization History  Administered Date(s) Administered  . Fluad Quad(high Dose 65+) 06/02/2019, 06/14/2020  . Influenza, High Dose Seasonal PF 06/15/2018  . Influenza,inj,Quad PF,6+ Mos 10/10/2016, 06/16/2017  . PFIZER(Purple Top)SARS-COV-2 Vaccination 10/18/2019, 11/07/2019, 10/02/2020  . Pneumococcal Conjugate-13 09/07/2013  . Pneumococcal Polysaccharide-23 07/31/2009, 07/04/2016  . Tdap 06/29/2006  . Zoster 07/25/2008    TDAP status: Due, Education has been provided regarding the importance of this vaccine. Advised may receive this vaccine at local pharmacy or Health Dept. Aware to provide a copy of the vaccination record if obtained from local pharmacy or Health Dept. Verbalized acceptance and understanding.  Flu Vaccine status: Up to date  Pneumococcal vaccine status: Up to date  Covid-19 vaccine status: Completed  vaccines  Qualifies for Shingles Vaccine? Yes   Zostavax completed Yes   Shingrix  Completed?: No.    Education has been provided regarding the importance of this vaccine. Patient has been advised to call insurance company to determine out of pocket expense if they have not yet received this vaccine. Advised may also receive vaccine at local pharmacy or Health Dept. Verbalized acceptance and understanding.  Screening Tests Health Maintenance  Topic Date Due  . TETANUS/TDAP  05/01/2021 (Originally 06/29/2016)  . INFLUENZA VACCINE  04/29/2021  . DEXA SCAN  Completed  . COVID-19 Vaccine  Completed  . PNA vac Low Risk Adult  Completed  . HPV VACCINES  Aged Out    Health Maintenance  There are no preventive care reminders to display for this patient.  Colorectal cancer screening: No longer required.   Mammogram status: Completed Bilateral 12/31/2020-Abnormal-Mammogram with ultrasound of right breast scheduled for 01/22/2021  Bone Density status: Due-Declined at this time.  Lung Cancer Screening: (Low Dose CT Chest recommended if Age 38-80 years, 30 pack-year currently smoking OR have quit w/in 15years.) does not qualify.    Additional Screening:  Hepatitis C Screening: does not qualify  Vision Screening: Recommended annual ophthalmology exams for early detection of glaucoma and other disorders of the eye. Is the patient up to date with their annual eye exam?  Yes  Who is the provider or what is the name of the office in which the patient attends annual eye exams? Dr. Gershon Crane   Dental Screening: Recommended annual dental exams for proper oral hygiene  Community Resource Referral / Chronic Care Management: CRR required this visit?  No   CCM required this visit?  No      Plan:     I have personally reviewed and noted the following in the patient's chart:   . Medical and social history . Use of alcohol, tobacco or illicit drugs  . Current medications and  supplements . Functional ability and status . Nutritional status . Physical activity . Advanced directives . List of other physicians . Hospitalizations, surgeries, and ER visits in previous 12 months . Vitals . Screenings to include cognitive, depression, and falls . Referrals and appointments  In addition, I have reviewed and discussed with patient certain preventive protocols, quality metrics, and best practice recommendations. A written personalized care plan for preventive services as well as general preventive health recommendations were provided to patient.     Marta Antu, LPN   [id number]  Nurse health Advisor  Nurse Notes: Patient's sister is with her today & assisted with answering some of the questions. She states she lives next door to the patient & patient stays with her most of the time.

## 2021-01-21 ENCOUNTER — Ambulatory Visit (INDEPENDENT_AMBULATORY_CARE_PROVIDER_SITE_OTHER): Payer: Medicare HMO

## 2021-01-21 ENCOUNTER — Other Ambulatory Visit: Payer: Self-pay

## 2021-01-21 VITALS — BP 122/60 | HR 70 | Temp 98.0°F | Resp 16 | Ht 65.0 in | Wt 125.0 lb

## 2021-01-21 DIAGNOSIS — Z0000 Encounter for general adult medical examination without abnormal findings: Secondary | ICD-10-CM

## 2021-01-21 NOTE — Patient Instructions (Signed)
Breanna Yang , Thank you for taking time to come for your Medicare Wellness Visit. I appreciate your ongoing commitment to your health goals. Please review the following plan we discussed and let me know if I can assist you in the future.   Screening recommendations/referrals: Colonoscopy: No longer required Mammogram: Completed 12/31/2020-U/S scheduled for 01/22/21 Bone Density: Due- Call the office when you are ready to schedule. Recommended yearly ophthalmology/optometry visit for glaucoma screening and checkup Recommended yearly dental visit for hygiene and checkup  Vaccinations: Influenza vaccine: Up to date Pneumococcal vaccine: Completed vaccines Tdap vaccine: Discuss with pharmacy Shingles vaccine: Discuss with pharmacy Covid-19:Completed vaccines  Advanced directives: Please bring a copy for your chart  Conditions/risks identified: See problem list  Next appointment: Follow up in one year for your annual wellness visit    Preventive Care 65 Years and Older, Female Preventive care refers to lifestyle choices and visits with your health care provider that can promote health and wellness. What does preventive care include?  A yearly physical exam. This is also called an annual well check.  Dental exams once or twice a year.  Routine eye exams. Ask your health care provider how often you should have your eyes checked.  Personal lifestyle choices, including:  Daily care of your teeth and gums.  Regular physical activity.  Eating a healthy diet.  Avoiding tobacco and drug use.  Limiting alcohol use.  Practicing safe sex.  Taking low-dose aspirin every day.  Taking vitamin and mineral supplements as recommended by your health care provider. What happens during an annual well check? The services and screenings done by your health care provider during your annual well check will depend on your age, overall health, lifestyle risk factors, and family history of  disease. Counseling  Your health care provider may ask you questions about your:  Alcohol use.  Tobacco use.  Drug use.  Emotional well-being.  Home and relationship well-being.  Sexual activity.  Eating habits.  History of falls.  Memory and ability to understand (cognition).  Work and work Statistician.  Reproductive health. Screening  You may have the following tests or measurements:  Height, weight, and BMI.  Blood pressure.  Lipid and cholesterol levels. These may be checked every 5 years, or more frequently if you are over 46 years old.  Skin check.  Lung cancer screening. You may have this screening every year starting at age 25 if you have a 30-pack-year history of smoking and currently smoke or have quit within the past 15 years.  Fecal occult blood test (FOBT) of the stool. You may have this test every year starting at age 61.  Flexible sigmoidoscopy or colonoscopy. You may have a sigmoidoscopy every 5 years or a colonoscopy every 10 years starting at age 46.  Hepatitis C blood test.  Hepatitis B blood test.  Sexually transmitted disease (STD) testing.  Diabetes screening. This is done by checking your blood sugar (glucose) after you have not eaten for a while (fasting). You may have this done every 1-3 years.  Bone density scan. This is done to screen for osteoporosis. You may have this done starting at age 33.  Mammogram. This may be done every 1-2 years. Talk to your health care provider about how often you should have regular mammograms. Talk with your health care provider about your test results, treatment options, and if necessary, the need for more tests. Vaccines  Your health care provider may recommend certain vaccines, such as:  Influenza vaccine.  This is recommended every year.  Tetanus, diphtheria, and acellular pertussis (Tdap, Td) vaccine. You may need a Td booster every 10 years.  Zoster vaccine. You may need this after age  40.  Pneumococcal 13-valent conjugate (PCV13) vaccine. One dose is recommended after age 73.  Pneumococcal polysaccharide (PPSV23) vaccine. One dose is recommended after age 31. Talk to your health care provider about which screenings and vaccines you need and how often you need them. This information is not intended to replace advice given to you by your health care provider. Make sure you discuss any questions you have with your health care provider. Document Released: 10/12/2015 Document Revised: 06/04/2016 Document Reviewed: 07/17/2015 Elsevier Interactive Patient Education  2017 [place] Prevention in the Home Falls can cause injuries. They can happen to people of all ages. There are many things you can do to make your home safe and to help prevent falls. What can I do on the outside of my home?  Regularly fix the edges of walkways and driveways and fix any cracks.  Remove anything that might make you trip as you walk through a door, such as a raised step or threshold.  Trim any bushes or trees on the path to your home.  Use bright outdoor lighting.  Clear any walking paths of anything that might make someone trip, such as rocks or tools.  Regularly check to see if handrails are loose or broken. Make sure that both sides of any steps have handrails.  Any raised decks and porches should have guardrails on the edges.  Have any leaves, snow, or ice cleared regularly.  Use sand or salt on walking paths during winter.  Clean up any spills in your garage right away. This includes oil or grease spills. What can I do in the bathroom?  Use night lights.  Install grab bars by the toilet and in the tub and shower. Do not use towel bars as grab bars.  Use non-skid mats or decals in the tub or shower.  If you need to sit down in the shower, use a plastic, non-slip stool.  Keep the floor dry. Clean up any water that spills on the floor as soon as it happens.  Remove  soap buildup in the tub or shower regularly.  Attach bath mats securely with double-sided non-slip rug tape.  Do not have throw rugs and other things on the floor that can make you trip. What can I do in the bedroom?  Use night lights.  Make sure that you have a light by your bed that is easy to reach.  Do not use any sheets or blankets that are too big for your bed. They should not hang down onto the floor.  Have a firm chair that has side arms. You can use this for support while you get dressed.  Do not have throw rugs and other things on the floor that can make you trip. What can I do in the kitchen?  Clean up any spills right away.  Avoid walking on wet floors.  Keep items that you use a lot in easy-to-reach places.  If you need to reach something above you, use a strong step stool that has a grab bar.  Keep electrical cords out of the way.  Do not use floor polish or wax that makes floors slippery. If you must use wax, use non-skid floor wax.  Do not have throw rugs and other things on the floor that can make  you trip. What can I do with my stairs?  Do not leave any items on the stairs.  Make sure that there are handrails on both sides of the stairs and use them. Fix handrails that are broken or loose. Make sure that handrails are as long as the stairways.  Check any carpeting to make sure that it is firmly attached to the stairs. Fix any carpet that is loose or worn.  Avoid having throw rugs at the top or bottom of the stairs. If you do have throw rugs, attach them to the floor with carpet tape.  Make sure that you have a light switch at the top of the stairs and the bottom of the stairs. If you do not have them, ask someone to add them for you. What else can I do to help prevent falls?  Wear shoes that:  Do not have high heels.  Have rubber bottoms.  Are comfortable and fit you well.  Are closed at the toe. Do not wear sandals.  If you use a  stepladder:  Make sure that it is fully opened. Do not climb a closed stepladder.  Make sure that both sides of the stepladder are locked into place.  Ask someone to hold it for you, if possible.  Clearly mark and make sure that you can see:  Any grab bars or handrails.  First and last steps.  Where the edge of each step is.  Use tools that help you move around (mobility aids) if they are needed. These include:  Canes.  Walkers.  Scooters.  Crutches.  Turn on the lights when you go into a dark area. Replace any light bulbs as soon as they burn out.  Set up your furniture so you have a clear path. Avoid moving your furniture around.  If any of your floors are uneven, fix them.  If there are any pets around you, be aware of where they are.  Review your medicines with your doctor. Some medicines can make you feel dizzy. This can increase your chance of falling. Ask your doctor what other things that you can do to help prevent falls. This information is not intended to replace advice given to you by your health care provider. Make sure you discuss any questions you have with your health care provider. Document Released: 07/12/2009 Document Revised: 02/21/2016 Document Reviewed: 10/20/2014 Elsevier Interactive Patient Education  2017 [employer].

## 2021-01-22 ENCOUNTER — Inpatient Hospital Stay: Admission: RE | Admit: 2021-01-22 | Payer: Medicare HMO | Source: Ambulatory Visit

## 2021-02-06 ENCOUNTER — Other Ambulatory Visit: Payer: Self-pay | Admitting: Family Medicine

## 2021-02-06 ENCOUNTER — Other Ambulatory Visit: Payer: Self-pay

## 2021-02-06 ENCOUNTER — Ambulatory Visit
Admission: RE | Admit: 2021-02-06 | Discharge: 2021-02-06 | Disposition: A | Discharge status: Home / Self Care | Payer: Medicare HMO | Source: Ambulatory Visit | Attending: Family Medicine | Admitting: Family Medicine

## 2021-02-06 DIAGNOSIS — R928 Other abnormal and inconclusive findings on diagnostic imaging of breast: Secondary | ICD-10-CM

## 2021-02-06 DIAGNOSIS — N6311 Unspecified lump in the right breast, upper outer quadrant: Secondary | ICD-10-CM | POA: Diagnosis not present

## 2021-02-06 DIAGNOSIS — R922 Inconclusive mammogram: Secondary | ICD-10-CM | POA: Diagnosis not present

## 2021-03-15 ENCOUNTER — Telehealth: Payer: Self-pay | Admitting: Family Medicine

## 2021-03-15 NOTE — Telephone Encounter (Signed)
Please schedule an office visit.

## 2021-03-15 NOTE — Telephone Encounter (Signed)
Agree.  Pt AND daughter need an OV.  I cannot discuss mom if she is not present for the appointment.

## 2021-03-15 NOTE — Telephone Encounter (Signed)
Patients daughter declined making appointment now - she is going to talk to her mom and bring [place] the medical power of attorney papers.  She will call back to reschedule
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
## 2021-03-26 ENCOUNTER — Other Ambulatory Visit: Payer: Self-pay | Admitting: Family Medicine

## 2021-04-18 DIAGNOSIS — H5213 Myopia, bilateral: Secondary | ICD-10-CM | POA: Diagnosis not present

## 2021-04-18 DIAGNOSIS — H524 Presbyopia: Secondary | ICD-10-CM | POA: Diagnosis not present

## 2021-04-18 DIAGNOSIS — Z961 Presence of intraocular lens: Secondary | ICD-10-CM | POA: Diagnosis not present

## 2021-04-18 DIAGNOSIS — H52203 Unspecified astigmatism, bilateral: Secondary | ICD-10-CM | POA: Diagnosis not present

## 2021-05-01 DIAGNOSIS — Z23 Encounter for immunization: Secondary | ICD-10-CM | POA: Diagnosis not present

## 2021-05-10 ENCOUNTER — Other Ambulatory Visit: Payer: Self-pay | Admitting: Family Medicine

## 2021-05-20 ENCOUNTER — Other Ambulatory Visit: Payer: Self-pay | Admitting: Family Medicine

## 2021-05-20 DIAGNOSIS — Z23 Encounter for immunization: Secondary | ICD-10-CM | POA: Diagnosis not present

## 2021-05-28 ENCOUNTER — Telehealth: Payer: Self-pay | Admitting: Family Medicine

## 2021-05-28 NOTE — Telephone Encounter (Signed)
Patient's daughter would like for Breanna Yang to call her before her mom's appointment on Friday.  Daughter is health care power of attorney and wants to explain that patient is suffering from dementia and it is getting worse.  Daughter is coming for appointment, but she doesn't feel like patient is capable of telling everything that needs to be seen and patient is wandering along road at times.

## 2021-05-31 ENCOUNTER — Other Ambulatory Visit: Payer: Self-pay

## 2021-05-31 ENCOUNTER — Ambulatory Visit (INDEPENDENT_AMBULATORY_CARE_PROVIDER_SITE_OTHER): Payer: Medicare HMO | Admitting: Registered Nurse

## 2021-05-31 VITALS — BP 122/66 | HR 97 | Temp 97.9°F | Resp 15 | Ht 65.0 in | Wt 121.0 lb

## 2021-05-31 DIAGNOSIS — Z789 Other specified health status: Secondary | ICD-10-CM

## 2021-05-31 DIAGNOSIS — R296 Repeated falls: Secondary | ICD-10-CM

## 2021-05-31 DIAGNOSIS — R413 Other amnesia: Secondary | ICD-10-CM | POA: Diagnosis not present

## 2021-05-31 NOTE — Progress Notes (Signed)
Established Patient Office Visit  Subjective:  Patient ID: Breanna Yang, female    DOB: 1934/08/18  Age: 87 y.o. MRN: [id number]  CC:  Chief Complaint  Patient presents with   Memory Loss    Pt daughter is concerned she needs more aid or other assistance discuss other changes in pt behavior     HPI Breanna Yang presents for memory loss  Falls at home - most recently was walking down the road to sister's house  Seem to be mechanical in nature, but daughter is concerned for potential harm  More forgetful with ADLs  Notably resistant to referrals, particularly to neurology. Does not want to be on medication.  Daughter is concerned that she can't be there all the time to monitor patient safety and is not as familiar with signs of progressive dementia or decrease in ADLs, med compliance, and other concerns.  Pt and daughter both requesting referral to home health services as they look to keep Breanna Yang in the home.   Past Medical History:  Diagnosis Date   Depression    GERD (gastroesophageal reflux disease)    Hyperlipidemia    Hypertension    Osteoarthritis    Osteoporosis     Past Surgical History:  Procedure Laterality Date   BRAIN SURGERY  1985   anneurysm    Family History  Problem Relation Age of Onset   Cancer Sister     Social History   Socioeconomic History   Marital status: Widowed    Spouse name: Not on file   Number of children: 3   Years of education: some college   Highest education level: Not on file  Occupational History   Not on file  Tobacco Use   Smoking status: Never   Smokeless tobacco: Never  Vaping Use   Vaping Use: Never used  Substance and Sexual Activity   Alcohol use: No   Drug use: No   Sexual activity: Not on file  Other Topics Concern   Not on file  Social History Narrative   Patient lives with daughter in a one story home.  Has 2 adopted children.  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  Education: some college.     Social Determinants of Health   Financial Resource Strain: Low Risk    Difficulty of Paying Living Expenses: Not hard at all  Food Insecurity: No Food Insecurity   Worried About Charity fundraiser in the Last Year: Never true   [place] in the Last Year: Never true  Transportation Needs: No Transportation Needs   Lack of Transportation (Medical): No   Lack of Transportation (Non-Medical): No  Physical Activity: Insufficiently Active   Days of Exercise per Week: 7 days   Minutes of Exercise per Session: 20 min  Stress: No Stress Concern Present   Feeling of Stress : Not at all  Social Connections: Moderately Isolated   Frequency of Communication with Friends and Family: More than three times a week   Frequency of Social Gatherings with Friends and Family: More than three times a week   Attends Religious Services: More than 4 times per year   Active Member of [employer] or Organizations: No   Attends Archivist Meetings: Never   Marital Status: Widowed  Intimate Partner Violence: Not At Risk   Fear of Current or Ex-Partner: No   Emotionally Abused: No   Physically Abused: No   Sexually Abused: No    Outpatient  Medications Prior to Visit  Medication Sig Dispense Refill   aspirin 81 MG tablet Take 81 mg by mouth daily.     Cholecalciferol (VITAMIN D) 2000 units tablet Take 2,000 Units by mouth daily.     DERMA-SMOOTHE/FS SCALP 0.01 % OIL Apply 1 a small amount to affected area as directed  Apply to scalp leave in qhs, wash out in the am     ENSURE (ENSURE) Take 237 mLs by mouth at bedtime.     fenofibrate 160 MG tablet TAKE 1 TABLET BY MOUTH EVERY DAY 90 tablet 1   ketoconazole (NIZORAL) 2 % shampoo Shampoo with  a small amount as directed three times a week     metoprolol succinate (TOPROL-XL) 50 MG 24 hr tablet TAKE 1 TABLET BY MOUTH EVERY DAY WITH OR IMMEDIATELY FOLLOWING A MEAL 90 tablet 1   Multiple Vitamin (MULTIVITAMIN) capsule Take 1 capsule by mouth daily.      PARoxetine (PAXIL) 20 MG tablet TAKE 1 TABLET BY MOUTH EVERY DAY 90 tablet 1   simvastatin (ZOCOR) 40 MG tablet TAKE 1 TABLET BY MOUTH EVERY EVENING AT 6PM 30 tablet 0   vitamin B-12 (CYANOCOBALAMIN) 500 MCG tablet Take 500 mcg by mouth daily.     Vitamin D, Ergocalciferol, (DRISDOL) 1.25 MG (50000 UNIT) CAPS capsule Take 1 capsule (50,000 Units total) by mouth every 7 (seven) days. 12 capsule 0   No facility-administered medications prior to visit.    No Known Allergies  ROS Review of Systems  Constitutional: Negative.   HENT: Negative.    Eyes: Negative.   Respiratory: Negative.    Cardiovascular: Negative.   Gastrointestinal: Negative.   Genitourinary: Negative.   Musculoskeletal: Negative.   Skin: Negative.   Neurological: Negative.   Psychiatric/Behavioral: Negative.    All other systems reviewed and are negative.    Objective:    Physical Exam Vitals and nursing note reviewed.  Constitutional:      General: She is not in acute distress.    Appearance: Normal appearance. She is normal weight. She is not ill-appearing, toxic-appearing or diaphoretic.  Cardiovascular:     Rate and Rhythm: Normal rate and regular rhythm.     Heart sounds: Normal heart sounds. No murmur heard.   No friction rub. No gallop.  Pulmonary:     Effort: Pulmonary effort is normal. No respiratory distress.     Breath sounds: Normal breath sounds. No stridor. No wheezing, rhonchi or rales.  Chest:     Chest wall: No tenderness.  Skin:    General: Skin is warm and dry.  Neurological:     General: No focal deficit present.     Mental Status: She is alert and oriented to person, place, and time. Mental status is at baseline.  Psychiatric:        Mood and Affect: Mood normal.        Behavior: Behavior normal.        Thought Content: Thought content normal.        Judgment: Judgment normal.    BP 122/66   Pulse 97   Temp 97.9 F (36.6 C) (Temporal)   Resp 15   Ht '5\' 5"'$  (1.651 m)    Wt 121 lb (54.9 kg)   SpO2 96%   BMI 20.14 kg/m  Wt Readings from Last 3 Encounters:  05/31/21 121 lb (54.9 kg)  01/21/21 125 lb (56.7 kg)  05/01/20 121 lb 4 oz (55 kg)     Health Maintenance Due  Topic Date Due   TETANUS/TDAP  06/29/2016   INFLUENZA VACCINE  04/29/2021    There are no preventive care reminders to display for this patient.  Lab Results  Component Value Date   TSH 4.05 05/01/2020   Lab Results  Component Value Date   WBC 3.6 (L) 05/01/2020   HGB 10.2 (L) 05/01/2020   HCT 30.0 (L) 05/01/2020   MCV 98.8 05/01/2020   PLT 218.0 05/01/2020   Lab Results  Component Value Date   NA 136 05/01/2020   K 4.0 05/01/2020   CO2 28 05/01/2020   GLUCOSE 89 05/01/2020   BUN 26 (H) 05/01/2020   CREATININE 1.00 05/01/2020   BILITOT 0.3 05/01/2020   ALKPHOS 23 (L) 05/01/2020   AST 20 05/01/2020   ALT 12 05/01/2020   PROT 8.2 05/01/2020   ALBUMIN 4.2 05/01/2020   CALCIUM 9.7 05/01/2020   ANIONGAP 6 11/05/2016   GFR 52.78 (L) 05/01/2020   Lab Results  Component Value Date   CHOL 186 05/01/2020   Lab Results  Component Value Date   HDL 53.30 05/01/2020   Lab Results  Component Value Date   LDLCALC 94 05/01/2020   Lab Results  Component Value Date   TRIG 194.0 (H) 05/01/2020   Lab Results  Component Value Date   CHOLHDL 3 05/01/2020   No results found for: HGBA1C    Assessment & Plan:   Problem List Items Addressed This Visit   None Visit Diagnoses     Multiple falls    -  Primary   Relevant Orders   Ambulatory referral to [place]   Memory deficit       Relevant Orders   Ambulatory referral to [place]   Decreased activities of daily living (ADL)       Relevant Orders   Ambulatory referral to [place]       No orders of the defined types were placed in this encounter.   Follow-up: No follow-ups on file.   PLAN Extremely pleasant patient with obvious signs of onset of dementia. Discussed importance of neuro workup,  which was declined by patient. Notes that home health would be very welcome. Consider nursing, PT, and OT.  Referral placed Patient encouraged to call clinic with any questions, comments, or concerns.  Maximiano Coss, NP

## 2021-06-04 ENCOUNTER — Ambulatory Visit: Payer: Medicare HMO | Admitting: Registered Nurse

## 2021-06-07 ENCOUNTER — Other Ambulatory Visit: Payer: Self-pay | Admitting: Family Medicine

## 2021-06-11 ENCOUNTER — Telehealth: Payer: Self-pay | Admitting: Family Medicine

## 2021-06-11 NOTE — Telephone Encounter (Signed)
Patients daughter called stating that she had spoken with Richard on 05/30/2021 about a referral for home care for her mom.  She has not heard anything yet and she starts a new job in 2 days - Please advise.

## 2021-06-11 NOTE — Telephone Encounter (Signed)
Encounter remains open so Home Health can be ordered from the office visit by the treating provider.  If she again makes threats to harm herself she needs to go to [facility] for evaluation

## 2021-06-11 NOTE — Telephone Encounter (Signed)
Called and spoke with daughter about patient needing home health care. Daughter stated that mom has previously walked out of the house to go to her sisters house and threatened to hurt herself if anyone tried to take her back home. Patient stated that she came in to see Richard and was approved for home health but no one ever got back in contact with her about it or filled out any forms. I spoke with pcp and per pcp daughter needs to take patient to [facility] to be evaluated but she says patient refuses. Daughter also feels like the police does not need to be called as she is not making any threats as of now. Pt stated that Insurance will cover 3 days per week of home health care. Please advise

## 2021-06-14 NOTE — Telephone Encounter (Signed)
Done,  Thanks,  Denice Paradise

## 2021-06-20 ENCOUNTER — Ambulatory Visit (INDEPENDENT_AMBULATORY_CARE_PROVIDER_SITE_OTHER): Payer: Medicare HMO | Admitting: Family Medicine

## 2021-06-20 ENCOUNTER — Other Ambulatory Visit: Payer: Self-pay

## 2021-06-20 ENCOUNTER — Encounter: Payer: Self-pay | Admitting: Family Medicine

## 2021-06-20 DIAGNOSIS — Z23 Encounter for immunization: Secondary | ICD-10-CM | POA: Diagnosis not present

## 2021-06-30 DIAGNOSIS — F0150 Vascular dementia without behavioral disturbance: Secondary | ICD-10-CM | POA: Diagnosis not present

## 2021-06-30 DIAGNOSIS — F32A Depression, unspecified: Secondary | ICD-10-CM | POA: Diagnosis not present

## 2021-06-30 DIAGNOSIS — I10 Essential (primary) hypertension: Secondary | ICD-10-CM | POA: Diagnosis not present

## 2021-06-30 DIAGNOSIS — Z8616 Personal history of COVID-19: Secondary | ICD-10-CM | POA: Diagnosis not present

## 2021-06-30 DIAGNOSIS — M810 Age-related osteoporosis without current pathological fracture: Secondary | ICD-10-CM | POA: Diagnosis not present

## 2021-06-30 DIAGNOSIS — K219 Gastro-esophageal reflux disease without esophagitis: Secondary | ICD-10-CM | POA: Diagnosis not present

## 2021-06-30 DIAGNOSIS — I69318 Other symptoms and signs involving cognitive functions following cerebral infarction: Secondary | ICD-10-CM | POA: Diagnosis not present

## 2021-06-30 DIAGNOSIS — Z79899 Other long term (current) drug therapy: Secondary | ICD-10-CM | POA: Diagnosis not present

## 2021-06-30 DIAGNOSIS — M1990 Unspecified osteoarthritis, unspecified site: Secondary | ICD-10-CM | POA: Diagnosis not present

## 2021-07-05 ENCOUNTER — Other Ambulatory Visit: Payer: Self-pay | Admitting: Family Medicine

## 2021-07-05 DIAGNOSIS — I69318 Other symptoms and signs involving cognitive functions following cerebral infarction: Secondary | ICD-10-CM | POA: Diagnosis not present

## 2021-07-05 DIAGNOSIS — F0150 Vascular dementia without behavioral disturbance: Secondary | ICD-10-CM | POA: Diagnosis not present

## 2021-07-05 DIAGNOSIS — I10 Essential (primary) hypertension: Secondary | ICD-10-CM | POA: Diagnosis not present

## 2021-07-05 DIAGNOSIS — Z79899 Other long term (current) drug therapy: Secondary | ICD-10-CM | POA: Diagnosis not present

## 2021-07-05 DIAGNOSIS — F32A Depression, unspecified: Secondary | ICD-10-CM | POA: Diagnosis not present

## 2021-07-05 DIAGNOSIS — M1990 Unspecified osteoarthritis, unspecified site: Secondary | ICD-10-CM | POA: Diagnosis not present

## 2021-07-05 DIAGNOSIS — K219 Gastro-esophageal reflux disease without esophagitis: Secondary | ICD-10-CM | POA: Diagnosis not present

## 2021-07-05 DIAGNOSIS — Z8616 Personal history of COVID-19: Secondary | ICD-10-CM | POA: Diagnosis not present

## 2021-07-05 DIAGNOSIS — M810 Age-related osteoporosis without current pathological fracture: Secondary | ICD-10-CM | POA: Diagnosis not present

## 2021-07-08 ENCOUNTER — Telehealth: Payer: Self-pay

## 2021-07-08 NOTE — Telephone Encounter (Signed)
Ok to proceed w/ verbal order for OT

## 2021-07-08 NOTE — Telephone Encounter (Signed)
Caller name:Amanda with [facility]   On DPR? :yes  Call back [phone]  Provider they see: Birdie Riddle   Reason for call: Occupational Therapy once a week for 4 weeks

## 2021-07-08 NOTE — Telephone Encounter (Signed)
VM left for v/o

## 2021-07-08 NOTE — Telephone Encounter (Signed)
Please advise on v/o. Pt last seen PCP 09/02.

## 2021-07-09 ENCOUNTER — Telehealth: Payer: Self-pay | Admitting: Family Medicine

## 2021-07-09 DIAGNOSIS — K219 Gastro-esophageal reflux disease without esophagitis: Secondary | ICD-10-CM | POA: Diagnosis not present

## 2021-07-09 DIAGNOSIS — I69318 Other symptoms and signs involving cognitive functions following cerebral infarction: Secondary | ICD-10-CM | POA: Diagnosis not present

## 2021-07-09 DIAGNOSIS — F0150 Vascular dementia without behavioral disturbance: Secondary | ICD-10-CM | POA: Diagnosis not present

## 2021-07-09 DIAGNOSIS — M810 Age-related osteoporosis without current pathological fracture: Secondary | ICD-10-CM | POA: Diagnosis not present

## 2021-07-09 DIAGNOSIS — Z8616 Personal history of COVID-19: Secondary | ICD-10-CM | POA: Diagnosis not present

## 2021-07-09 DIAGNOSIS — I10 Essential (primary) hypertension: Secondary | ICD-10-CM | POA: Diagnosis not present

## 2021-07-09 DIAGNOSIS — F32A Depression, unspecified: Secondary | ICD-10-CM | POA: Diagnosis not present

## 2021-07-09 DIAGNOSIS — Z79899 Other long term (current) drug therapy: Secondary | ICD-10-CM | POA: Diagnosis not present

## 2021-07-09 DIAGNOSIS — M1990 Unspecified osteoarthritis, unspecified site: Secondary | ICD-10-CM | POA: Diagnosis not present

## 2021-07-09 NOTE — Telephone Encounter (Signed)
Type of form received:   Home Health  Additional comments:   Received by:   Tye Savoy should be Faxed/mailed to: (address/ fax #) [phone]  Is patient requesting call for pickup: NO  Form placed:  In Richard's bin up front  Attach charge sheet.  Provider will determine charge.  Individual made aware of 3-5 business day turn around No?

## 2021-07-09 NOTE — Telephone Encounter (Signed)
Form placed in to be reviewed.

## 2021-07-12 ENCOUNTER — Other Ambulatory Visit: Payer: Self-pay | Admitting: Family Medicine

## 2021-07-16 DIAGNOSIS — M1990 Unspecified osteoarthritis, unspecified site: Secondary | ICD-10-CM | POA: Diagnosis not present

## 2021-07-16 DIAGNOSIS — Z8616 Personal history of COVID-19: Secondary | ICD-10-CM | POA: Diagnosis not present

## 2021-07-16 DIAGNOSIS — I10 Essential (primary) hypertension: Secondary | ICD-10-CM | POA: Diagnosis not present

## 2021-07-16 DIAGNOSIS — M810 Age-related osteoporosis without current pathological fracture: Secondary | ICD-10-CM | POA: Diagnosis not present

## 2021-07-16 DIAGNOSIS — F0150 Vascular dementia without behavioral disturbance: Secondary | ICD-10-CM | POA: Diagnosis not present

## 2021-07-16 DIAGNOSIS — K219 Gastro-esophageal reflux disease without esophagitis: Secondary | ICD-10-CM | POA: Diagnosis not present

## 2021-07-16 DIAGNOSIS — Z79899 Other long term (current) drug therapy: Secondary | ICD-10-CM | POA: Diagnosis not present

## 2021-07-16 DIAGNOSIS — I69318 Other symptoms and signs involving cognitive functions following cerebral infarction: Secondary | ICD-10-CM | POA: Diagnosis not present

## 2021-07-16 DIAGNOSIS — F32A Depression, unspecified: Secondary | ICD-10-CM | POA: Diagnosis not present

## 2021-07-18 NOTE — Telephone Encounter (Signed)
Noted  

## 2021-07-18 NOTE — Telephone Encounter (Signed)
Form completed and placed in basket  

## 2021-07-22 DIAGNOSIS — Z23 Encounter for immunization: Secondary | ICD-10-CM | POA: Diagnosis not present

## 2021-07-28 ENCOUNTER — Other Ambulatory Visit: Payer: Self-pay | Admitting: Family Medicine

## 2021-07-30 DIAGNOSIS — I69318 Other symptoms and signs involving cognitive functions following cerebral infarction: Secondary | ICD-10-CM | POA: Diagnosis not present

## 2021-07-30 DIAGNOSIS — M810 Age-related osteoporosis without current pathological fracture: Secondary | ICD-10-CM | POA: Diagnosis not present

## 2021-07-30 DIAGNOSIS — Z8616 Personal history of COVID-19: Secondary | ICD-10-CM | POA: Diagnosis not present

## 2021-07-30 DIAGNOSIS — M1990 Unspecified osteoarthritis, unspecified site: Secondary | ICD-10-CM | POA: Diagnosis not present

## 2021-07-30 DIAGNOSIS — F32A Depression, unspecified: Secondary | ICD-10-CM | POA: Diagnosis not present

## 2021-07-30 DIAGNOSIS — I10 Essential (primary) hypertension: Secondary | ICD-10-CM | POA: Diagnosis not present

## 2021-07-30 DIAGNOSIS — K219 Gastro-esophageal reflux disease without esophagitis: Secondary | ICD-10-CM | POA: Diagnosis not present

## 2021-07-30 DIAGNOSIS — F0150 Vascular dementia without behavioral disturbance: Secondary | ICD-10-CM | POA: Diagnosis not present

## 2021-07-30 DIAGNOSIS — Z79899 Other long term (current) drug therapy: Secondary | ICD-10-CM | POA: Diagnosis not present

## 2021-08-12 ENCOUNTER — Other Ambulatory Visit: Payer: Medicare HMO

## 2021-08-16 DIAGNOSIS — I10 Essential (primary) hypertension: Secondary | ICD-10-CM | POA: Diagnosis not present

## 2021-08-16 DIAGNOSIS — Z79899 Other long term (current) drug therapy: Secondary | ICD-10-CM | POA: Diagnosis not present

## 2021-08-16 DIAGNOSIS — K219 Gastro-esophageal reflux disease without esophagitis: Secondary | ICD-10-CM | POA: Diagnosis not present

## 2021-08-16 DIAGNOSIS — M810 Age-related osteoporosis without current pathological fracture: Secondary | ICD-10-CM | POA: Diagnosis not present

## 2021-08-16 DIAGNOSIS — Z8616 Personal history of COVID-19: Secondary | ICD-10-CM | POA: Diagnosis not present

## 2021-08-16 DIAGNOSIS — F32A Depression, unspecified: Secondary | ICD-10-CM | POA: Diagnosis not present

## 2021-08-16 DIAGNOSIS — M1990 Unspecified osteoarthritis, unspecified site: Secondary | ICD-10-CM | POA: Diagnosis not present

## 2021-08-16 DIAGNOSIS — I69318 Other symptoms and signs involving cognitive functions following cerebral infarction: Secondary | ICD-10-CM | POA: Diagnosis not present

## 2021-08-16 DIAGNOSIS — F0150 Vascular dementia without behavioral disturbance: Secondary | ICD-10-CM | POA: Diagnosis not present

## 2021-08-21 ENCOUNTER — Other Ambulatory Visit: Payer: Self-pay | Admitting: Family Medicine

## 2021-08-24 ENCOUNTER — Other Ambulatory Visit: Payer: Self-pay | Admitting: Family Medicine

## 2021-08-26 DIAGNOSIS — M810 Age-related osteoporosis without current pathological fracture: Secondary | ICD-10-CM | POA: Diagnosis not present

## 2021-08-26 DIAGNOSIS — F32A Depression, unspecified: Secondary | ICD-10-CM | POA: Diagnosis not present

## 2021-08-26 DIAGNOSIS — Z8616 Personal history of COVID-19: Secondary | ICD-10-CM | POA: Diagnosis not present

## 2021-08-26 DIAGNOSIS — I69318 Other symptoms and signs involving cognitive functions following cerebral infarction: Secondary | ICD-10-CM | POA: Diagnosis not present

## 2021-08-26 DIAGNOSIS — Z79899 Other long term (current) drug therapy: Secondary | ICD-10-CM | POA: Diagnosis not present

## 2021-08-26 DIAGNOSIS — F0150 Vascular dementia without behavioral disturbance: Secondary | ICD-10-CM | POA: Diagnosis not present

## 2021-08-26 DIAGNOSIS — K219 Gastro-esophageal reflux disease without esophagitis: Secondary | ICD-10-CM | POA: Diagnosis not present

## 2021-08-26 DIAGNOSIS — M1990 Unspecified osteoarthritis, unspecified site: Secondary | ICD-10-CM | POA: Diagnosis not present

## 2021-08-26 DIAGNOSIS — I10 Essential (primary) hypertension: Secondary | ICD-10-CM | POA: Diagnosis not present

## 2021-08-28 ENCOUNTER — Ambulatory Visit
Admission: RE | Admit: 2021-08-28 | Discharge: 2021-08-28 | Disposition: A | Discharge status: Home / Self Care | Payer: Medicare HMO | Source: Ambulatory Visit | Attending: Family Medicine | Admitting: Family Medicine

## 2021-08-28 ENCOUNTER — Other Ambulatory Visit: Payer: Self-pay | Admitting: Family Medicine

## 2021-08-28 DIAGNOSIS — R928 Other abnormal and inconclusive findings on diagnostic imaging of breast: Secondary | ICD-10-CM

## 2021-08-28 DIAGNOSIS — R922 Inconclusive mammogram: Secondary | ICD-10-CM | POA: Diagnosis not present

## 2021-08-28 DIAGNOSIS — N6310 Unspecified lump in the right breast, unspecified quadrant: Secondary | ICD-10-CM

## 2021-09-16 ENCOUNTER — Telehealth: Payer: Self-pay | Admitting: Family Medicine

## 2021-09-16 DIAGNOSIS — Z8616 Personal history of COVID-19: Secondary | ICD-10-CM

## 2021-09-16 DIAGNOSIS — F32A Depression, unspecified: Secondary | ICD-10-CM | POA: Diagnosis not present

## 2021-09-16 DIAGNOSIS — F0150 Vascular dementia without behavioral disturbance: Secondary | ICD-10-CM | POA: Diagnosis not present

## 2021-09-16 DIAGNOSIS — K219 Gastro-esophageal reflux disease without esophagitis: Secondary | ICD-10-CM

## 2021-09-16 DIAGNOSIS — I69318 Other symptoms and signs involving cognitive functions following cerebral infarction: Secondary | ICD-10-CM | POA: Diagnosis not present

## 2021-09-16 DIAGNOSIS — M810 Age-related osteoporosis without current pathological fracture: Secondary | ICD-10-CM

## 2021-09-16 DIAGNOSIS — Z9181 History of falling: Secondary | ICD-10-CM

## 2021-09-16 DIAGNOSIS — M1990 Unspecified osteoarthritis, unspecified site: Secondary | ICD-10-CM

## 2021-09-16 DIAGNOSIS — Z79899 Other long term (current) drug therapy: Secondary | ICD-10-CM

## 2021-09-16 DIAGNOSIS — I10 Essential (primary) hypertension: Secondary | ICD-10-CM | POA: Diagnosis not present

## 2021-09-16 NOTE — Telephone Encounter (Signed)
I have placed orders in the bin upfront order # [id number] [facility]

## 2021-09-19 DIAGNOSIS — F32A Depression, unspecified: Secondary | ICD-10-CM | POA: Diagnosis not present

## 2021-09-19 DIAGNOSIS — F0150 Vascular dementia without behavioral disturbance: Secondary | ICD-10-CM | POA: Diagnosis not present

## 2021-09-19 DIAGNOSIS — I10 Essential (primary) hypertension: Secondary | ICD-10-CM | POA: Diagnosis not present

## 2021-09-19 DIAGNOSIS — M1990 Unspecified osteoarthritis, unspecified site: Secondary | ICD-10-CM | POA: Diagnosis not present

## 2021-09-19 DIAGNOSIS — Z79899 Other long term (current) drug therapy: Secondary | ICD-10-CM | POA: Diagnosis not present

## 2021-09-19 DIAGNOSIS — Z8616 Personal history of COVID-19: Secondary | ICD-10-CM | POA: Diagnosis not present

## 2021-09-19 DIAGNOSIS — I69318 Other symptoms and signs involving cognitive functions following cerebral infarction: Secondary | ICD-10-CM | POA: Diagnosis not present

## 2021-09-19 DIAGNOSIS — K219 Gastro-esophageal reflux disease without esophagitis: Secondary | ICD-10-CM | POA: Diagnosis not present

## 2021-09-19 DIAGNOSIS — M810 Age-related osteoporosis without current pathological fracture: Secondary | ICD-10-CM | POA: Diagnosis not present

## 2021-09-25 ENCOUNTER — Other Ambulatory Visit: Payer: Self-pay | Admitting: Family Medicine

## 2021-10-18 ENCOUNTER — Other Ambulatory Visit: Payer: Self-pay | Admitting: Family Medicine

## 2021-11-14 ENCOUNTER — Other Ambulatory Visit: Payer: Self-pay | Admitting: Family Medicine

## 2021-12-07 ENCOUNTER — Other Ambulatory Visit: Payer: Self-pay | Admitting: Registered Nurse

## 2021-12-26 ENCOUNTER — Other Ambulatory Visit: Payer: Self-pay | Admitting: Registered Nurse

## 2021-12-26 ENCOUNTER — Telehealth: Payer: Self-pay | Admitting: Family Medicine

## 2021-12-26 DIAGNOSIS — F0150 Vascular dementia without behavioral disturbance: Secondary | ICD-10-CM

## 2021-12-26 NOTE — Telephone Encounter (Signed)
Pt's daughter called and stated that her mother's memory is getting much worse to the point of where they need extra care. [facility] in [place] can take her but would need a referral. Daughter also states that patient has become hard to bring in due to being combative and refusing to go anywhere. I let her know that I would send this message to Dr Birdie Riddle to see what she thought would be best and ask about the referral. Daughter also aware that Dr Birdie Riddle would not be back in the office until April 3rd she understood and was ok with waiting ?

## 2021-12-27 NOTE — Telephone Encounter (Signed)
Provided daughter with [facility] phone number ?

## 2021-12-27 NOTE — Telephone Encounter (Signed)
Typically we don't do referrals for assisted living or nursing facilities.  There is an admissions process that involves paper work and TB testing, etc.  I have not seen pt since August 2021.  I can place a [facility] care management referral (done) but I don't think I am able to directly refer for higher level of care. ?

## 2021-12-27 NOTE — Addendum Note (Signed)
Addended by: Midge Minium on: 12/27/2021 10:01 AM ? ? Modules accepted: Orders ? ?

## 2021-12-30 ENCOUNTER — Encounter: Payer: Self-pay | Admitting: Family Medicine

## 2021-12-30 ENCOUNTER — Telehealth: Payer: Self-pay | Admitting: *Deleted

## 2021-12-30 NOTE — Telephone Encounter (Addendum)
?  Care Management  ? ?Follow Up Note ? ? ?12/30/2021 ?Name: NAMYA VOGES MRN: [id number] DOB: April 21, 1934 ? ? ?Referred by: Midge Minium, MD ?Reason for referral : Care Coordination ? ? ?An unsuccessful telephone outreach was attempted today. The patient was referred to the case management team for assistance with care management and care coordination.  ? ?Chart reviewed, referral received to assist with placement for patient at [facility]. Placement will likely request an updated FL2 which requires a face to face visit with the provider within the last 30 days. The patient will be scheduled for an appointment with the assigned LCSW over the next 7-10 business days to discuss assistance needs further. ? ?Follow Up Plan: Telephone follow up appointment with care management team member to be scheduled by Careguide within the next 7-10 business days. ? ?[employer], LCSW ?[facility] ?[phone] ? ? ?

## 2021-12-31 ENCOUNTER — Ambulatory Visit: Payer: Self-pay | Admitting: *Deleted

## 2021-12-31 ENCOUNTER — Telehealth: Payer: Self-pay | Admitting: *Deleted

## 2021-12-31 DIAGNOSIS — R296 Repeated falls: Secondary | ICD-10-CM

## 2021-12-31 DIAGNOSIS — F0150 Vascular dementia without behavioral disturbance: Secondary | ICD-10-CM

## 2021-12-31 NOTE — Chronic Care Management (AMB) (Signed)
Care Management ?Clinical Social Work Note ? ?12/31/2021 ?Name: Breanna Yang MRN: [id number] DOB: August 09, 1934 ? ?Breanna Yang is a 88 y.o. year old female who is a primary care patient of Breanna Yang, Breanna Millet, MD.  The Care Management team was consulted for assistance with chronic disease management and coordination needs. ? ?Collaboration with patient's daughter  for initial visit in response to provider referral for social work chronic care management and care coordination services ? ?Consent to Services:  ?[name] was given information about Care Management services today including:  ?Care Management services includes personalized support from designated clinical staff supervised by her physician, including individualized plan of care and coordination with other care providers ?24/7 contact phone numbers for assistance for urgent and routine care needs. ?The patient may stop case management services at any time by phone call to the office staff. ? ?Patient agreed to services and consent obtained.  ? ?Assessment: Review of patient past medical history, allergies, medications, and health status, including review of relevant consultants reports was performed today as part of a comprehensive evaluation and provision of chronic care management and care coordination services. ? ?SDOH (Social Determinants of Health) assessments and interventions performed:   ? ?Advanced Directives Status: Not addressed in this encounter. ? ?Care Plan ? ?No Known Allergies ? ?Outpatient Encounter Medications as of 12/31/2021  ?Medication Sig  ? aspirin 81 MG tablet Take 81 mg by mouth daily.  ? Cholecalciferol (VITAMIN D) 2000 units tablet Take 2,000 Units by mouth daily.  ? DERMA-SMOOTHE/FS SCALP 0.01 % OIL Apply 1 a small amount to affected area as directed  Apply to scalp leave in qhs, wash out in the am  ? ENSURE (ENSURE) Take 237 mLs by mouth at bedtime.  ? fenofibrate 160 MG tablet TAKE 1 TABLET BY MOUTH EVERY DAY  ?  ketoconazole (NIZORAL) 2 % shampoo Shampoo with  a small amount as directed three times a week  ? metoprolol succinate (TOPROL-XL) 50 MG 24 hr tablet TAKE 1 TABLET BY MOUTH EVERY DAY WITH OR IMMEDIATELY FOLLOWING A MEAL  ? Multiple Vitamin (MULTIVITAMIN) capsule Take 1 capsule by mouth daily.  ? PARoxetine (PAXIL) 20 MG tablet TAKE 1 TABLET BY MOUTH EVERY DAY  ? simvastatin (ZOCOR) 40 MG tablet TAKE 1 TABLET BY MOUTH EVERY EVENING AT 6PM  ? vitamin B-12 (CYANOCOBALAMIN) 500 MCG tablet Take 500 mcg by mouth daily.  ? Vitamin D, Ergocalciferol, (DRISDOL) 1.25 MG (50000 UNIT) CAPS capsule Take 1 capsule (50,000 Units total) by mouth every 7 (seven) days.  ? ?No facility-administered encounter medications on file as of 12/31/2021.  ? ? ?Patient Active Problem List  ? Diagnosis Date Noted  ? Weight loss 12/14/2019  ? History of COVID-19 10/31/2019  ? Vascular dementia without behavioral disturbance ([place]) 10/13/2017  ? Hx of syncope 04/06/2017  ? Gait instability 04/06/2017  ? Physical exam 01/06/2017  ? GERD (gastroesophageal reflux disease) 11/13/2016  ? Encephalomalacia 11/13/2016  ? HTN (hypertension) 07/04/2016  ? Hyperlipidemia 07/04/2016  ? Osteoporosis 07/04/2016  ? ? ?Conditions to be addressed/monitored: Dementia; Level of care concerns ? ?Care Plan : General Social Work (Adult)  ?Updates made by Vern Claude, LCSW since 12/31/2021 12:00 AM  ?  ? ?Problem: CHL AMB "PATIENT-SPECIFIC PROBLEM"   ?Note:   ?Current Barriers:   ?Patient's health has declined and longer able to meet ADL's at home ?Acknowledges deficits with education and support in order to meet this need ?Does not adhere to prescribed medication regimen ?  Unable to perform IADLs independently ?Level of care concerns ?Clinical Goal(s): Over the next 30 to 45 days patient will be placed in a facility, patient and family will work with LCSW, to coordinate needs for long term skilled nursing facility  Placement ?Interventions: ?Collaboration with Midge Minium, MD regarding development and update of comprehensive plan of care as evidenced by provider attestation and co-signature ?Briefly assessed needs and provided education on level of care and facility placement process-patient's daughter working at the time of the call-availability limited ?Patient's daughter concerned that her condition is becoming progressively worse-patient resides with her sister most days  but does have periods of where she is home alone-patient safety a concern ?Per patient's daughter, patient adamantly refuses in home care and placement at this time, however may possibly be agreeable to [facility] as last resort ?Patient has a scheduled appointment for a physical on 01/01/22 ?Discussed need to request a completed Fl2 to provide to [facility] for review-patient's daughter agreeable ? ?Patient Goals/Self-Care Activities  ?Over the next 30 days, patient and/or designated caregiver will:  ? - complete physical and will request Fl2 for placement purposes ?Follow Up Plan:  Telephone follow up with daughter to be scheduled within the next 7-10 business days ? ? ? ? ? ?   ? ? ?  ? ?. ? ? ? ? ? ? ? ?  ?  ? ?Follow Up Plan: SW will follow up with patient by phone over the next 7-10 business days ? ?Breanna Klaus, LCSW ?[facility] ?[phone] ? ?

## 2021-12-31 NOTE — Patient Instructions (Signed)
Visit Information ? ?Thank you for taking time to visit with me today. Please don't hesitate to contact me if I can be of assistance to you before our next scheduled telephone appointment. ? ?Following are the goals we discussed today:  ?Please request Fl2 from providers office for placement consideration ? ?Our next appointment TBD ? ?Please call the care guide team at [phone] if you need to cancel or reschedule your appointment.  ? ?If you are experiencing a Mental Health or [place] or need someone to talk to, please call the Suicide and Crisis Lifeline: 988  ? ?Following is a copy of your full plan of care:  ?Care Plan : General Social Work (Adult)  ?Updates made by Vern Claude, LCSW since 12/31/2021 12:00 AM  ?  ? ?Problem: CHL AMB "PATIENT-SPECIFIC PROBLEM"   ?Note:   ?Current Barriers:   ?Patient's health has declined and longer able to meet ADL's at home ?Acknowledges deficits with education and support in order to meet this need ?Does not adhere to prescribed medication regimen ?Unable to perform IADLs independently ?Level of care concerns ?Clinical Goal(s): Over the next 30 to 45 days patient will be placed in a facility, patient and family will work with LCSW, to coordinate needs for long term skilled nursing facility  Placement ?Interventions: ?Collaboration with Midge Minium, MD regarding development and update of comprehensive plan of care as evidenced by provider attestation and co-signature ?Briefly assessed needs and provided education on level of care and facility placement process-patient's daughter working at the time of the call-availability limited ?Patient's daughter concerned that her condition is becoming progressively worse-patient resides with her sister most days  but does have periods of where she is home alone-patient safety a concern ?Per patient's daughter, patient adamantly refuses in home care and placement at this time, however may possibly be  agreeable to [facility] as last resort ?Patient has a scheduled appointment for a physical on 01/01/22 ?Discussed need to request a completed Fl2 to provide to [facility] for review-patient's daughter agreeable ? ?Patient Goals/Self-Care Activities  ?Over the next 30 days, patient and/or designated caregiver will:  ? - complete physical and will request Fl2 for placement purposes ?Follow Up Plan:  Telephone follow up with daughter to be scheduled within the next 7-10 business days ? ? ? ? ? ?   ? ? ?  ? ?. ?CCMCPFACILITYPLACMENT ? ? ? ? ? ? ?  ? ? ?Ms. Hecker was given information about Care Management services by the embedded care coordination team including:  ?Care Management services include personalized support from designated clinical staff supervised by her physician, including individualized plan of care and coordination with other care providers ?24/7 contact phone numbers for assistance for urgent and routine care needs. ?The patient may stop CCM services at any time (effective at the end of the month) by phone call to the office staff. ? ?Patient agreed to services and verbal consent obtained.  ? ?Patient verbalizes understanding of instructions and care plan provided today and agrees to view in [place]. Active MyChart status confirmed with patient.   ? ?Telephone follow up appointment with care management team member scheduled for: To be scheduled by Careguide ? ?Braylon Grenda, LCSW ?[facility] ?[phone] ? ? ?  ?

## 2021-12-31 NOTE — Telephone Encounter (Signed)
?  Care Management  ? ?Follow Up Note ? ? ?12/31/2021 ?Name: KAYLE PASSARELLI MRN: [id number] DOB: 10/03/1933 ? ? ?Referred by: Midge Minium, MD ?Reason for referral : Care Coordination ? ? ?A second unsuccessful telephone outreach was attempted today. The patient was referred to the case management team for assistance with care management and care coordination.  ? ?Follow Up Plan: Telephone follow up appointment with care management team member to be re-scheduled by Careguide. ? ?[employer], LCSW ?[facility] ?[phone] ? ? ?

## 2022-01-01 ENCOUNTER — Ambulatory Visit (INDEPENDENT_AMBULATORY_CARE_PROVIDER_SITE_OTHER): Payer: Medicare HMO | Admitting: Family Medicine

## 2022-01-01 ENCOUNTER — Encounter: Payer: Self-pay | Admitting: Family Medicine

## 2022-01-01 VITALS — BP 100/60 | HR 81 | Temp 97.9°F | Resp 16 | Ht 65.0 in | Wt 105.4 lb

## 2022-01-01 DIAGNOSIS — R634 Abnormal weight loss: Secondary | ICD-10-CM | POA: Diagnosis not present

## 2022-01-01 DIAGNOSIS — E785 Hyperlipidemia, unspecified: Secondary | ICD-10-CM | POA: Diagnosis not present

## 2022-01-01 DIAGNOSIS — I10 Essential (primary) hypertension: Secondary | ICD-10-CM | POA: Diagnosis not present

## 2022-01-01 DIAGNOSIS — F0150 Vascular dementia without behavioral disturbance: Secondary | ICD-10-CM | POA: Diagnosis not present

## 2022-01-01 DIAGNOSIS — M810 Age-related osteoporosis without current pathological fracture: Secondary | ICD-10-CM

## 2022-01-01 LAB — CBC WITH DIFFERENTIAL/PLATELET
Basophils Absolute: 0 10*3/uL (ref 0.0–0.1)
Basophils Relative: 0.4 % (ref 0.0–3.0)
Eosinophils Absolute: 0 10*3/uL (ref 0.0–0.7)
Eosinophils Relative: 0.4 % (ref 0.0–5.0)
HCT: 26 % — ABNORMAL LOW (ref 36.0–46.0)
Hemoglobin: 8.7 g/dL — ABNORMAL LOW (ref 12.0–15.0)
Lymphocytes Relative: 31.1 % (ref 12.0–46.0)
Lymphs Abs: 1.2 10*3/uL (ref 0.7–4.0)
MCHC: 33.6 g/dL (ref 30.0–36.0)
MCV: 98.6 fl (ref 78.0–100.0)
Monocytes Absolute: 0.4 10*3/uL (ref 0.1–1.0)
Monocytes Relative: 10.1 % (ref 3.0–12.0)
Neutro Abs: 2.2 10*3/uL (ref 1.4–7.7)
Neutrophils Relative %: 58 % (ref 43.0–77.0)
Platelets: 209 10*3/uL (ref 150.0–400.0)
RBC: 2.64 Mil/uL — ABNORMAL LOW (ref 3.87–5.11)
RDW: 14.1 % (ref 11.5–15.5)
WBC: 3.7 10*3/uL — ABNORMAL LOW (ref 4.0–10.5)

## 2022-01-01 LAB — TSH: TSH: 3 u[IU]/mL (ref 0.35–5.50)

## 2022-01-01 LAB — HEPATIC FUNCTION PANEL
ALT: 15 U/L (ref 0–35)
AST: 29 U/L (ref 0–37)
Albumin: 4.1 g/dL (ref 3.5–5.2)
Alkaline Phosphatase: 22 U/L — ABNORMAL LOW (ref 39–117)
Bilirubin, Direct: 0.1 mg/dL (ref 0.0–0.3)
Total Bilirubin: 0.5 mg/dL (ref 0.2–1.2)
Total Protein: 7.7 g/dL (ref 6.0–8.3)

## 2022-01-01 LAB — BASIC METABOLIC PANEL
BUN: 28 mg/dL — ABNORMAL HIGH (ref 6–23)
CO2: 26 mEq/L (ref 19–32)
Calcium: 9.7 mg/dL (ref 8.4–10.5)
Chloride: 104 mEq/L (ref 96–112)
Creatinine, Ser: 1.15 mg/dL (ref 0.40–1.20)
GFR: 43.29 mL/min — ABNORMAL LOW (ref >60.00)
Glucose, Bld: 78 mg/dL (ref 70–99)
Potassium: 4 mEq/L (ref 3.5–5.1)
Sodium: 137 mEq/L (ref 135–145)

## 2022-01-01 LAB — LIPID PANEL
Cholesterol: 160 mg/dL (ref 0–200)
HDL: 46.2 mg/dL (ref >39.00)
LDL Cholesterol: 84 mg/dL (ref 0–99)
NonHDL: 113.76
Total CHOL/HDL Ratio: 3
Triglycerides: 147 mg/dL (ref 0.0–149.0)
VLDL: 29.4 mg/dL (ref 0.0–40.0)

## 2022-01-01 LAB — VITAMIN D 25 HYDROXY (VIT D DEFICIENCY, FRACTURES): VITD: 21.21 ng/mL — ABNORMAL LOW (ref 30.00–100.00)

## 2022-01-01 NOTE — Progress Notes (Signed)
? ?  Subjective:  ? ? Patient ID: Breanna Yang, female    DOB: 05/06/1934, 88 y.o.   MRN: [id number] ? ?HPI ?Dementia- pt and sister indicate that she is living w/ sister Webb Silversmith.  Not yet living there full time.  Pt and sister deny that she has been wandering.  Sister Webb Silversmith) reports that a few months ago, pt walked over to her house- 2 blocks away.  Sister reports pt's memory is 'not good'.  Pt has been taking medication by herself- daughter indicated she has not been taking correctly.  Pt is down 16 lbs since last visit in Aug 2021.  Pt reports daughter wants her to live w/ Webb Silversmith.  States they have not discussed Assisted Living or NH.  Pt reports feeling safe w/ her sister.  Webb Silversmith reports daughter indicated she will keep mom for a few days each week. ? ?Hypotension- pt's BP is 100/60.  Currently on Metoprolol '50mg'$  daily. ? ?Unintentional weight loss- pt reports decreased appetite.  Sister states she is only taking a few bites of food at a time.  Sister takes her out to eat, has her drink boost.   ? ? ?Review of Systems ?For ROS see HPI  ? ?   ?Objective:  ? Physical Exam ?Constitutional:   ?   General: She is not in acute distress. ?   Appearance: Normal appearance. She is well-developed.  ?HENT:  ?   Head: Normocephalic and atraumatic.  ?Eyes:  ?   Conjunctiva/sclera: Conjunctivae normal.  ?   Pupils: Pupils are equal, round, and reactive to light.  ?Neck:  ?   Thyroid: No thyromegaly.  ?Cardiovascular:  ?   Rate and Rhythm: Normal rate and regular rhythm.  ?Pulmonary:  ?   Effort: Pulmonary effort is normal. No respiratory distress.  ?   Breath sounds: Normal breath sounds.  ?Abdominal:  ?   General: There is no distension.  ?   Palpations: Abdomen is soft.  ?   Tenderness: There is no abdominal tenderness.  ?Musculoskeletal:  ?   Cervical back: Normal range of motion and neck supple.  ?   Right lower leg: No edema.  ?   Left lower leg: No edema.  ?Lymphadenopathy:  ?   Cervical: No cervical adenopathy.  ?Skin: ?    General: Skin is warm and dry.  ?Neurological:  ?   Mental Status: She is alert. Mental status is at baseline.  ?Psychiatric:     ?   Mood and Affect: Mood normal.     ?   Behavior: Behavior normal.  ? ? ? ? ? ?   ?Assessment & Plan:  ? ? ?

## 2022-01-01 NOTE — Patient Instructions (Addendum)
Follow up in 6 months- sooner if needed ?We'll notify you of your lab results and make any changes if needed ?STOP the Fenofibrate, Simvastatin, Metoprolol ?CONTINUE the Paroxetine (Paxil) once daily ?Make sure you are eating regularly ?Please make sure someone is helping you with your medication so that you are taking it correctly (Paxil is once daily) ?Call with any questions or concerns ?Stay Safe!  Stay Healthy! ?Happy [place] Birthday!! ?

## 2022-01-02 ENCOUNTER — Other Ambulatory Visit: Payer: Self-pay

## 2022-01-02 MED ORDER — VITAMIN D (ERGOCALCIFEROL) 1.25 MG (50000 UNIT) PO CAPS: 50000.0000 [IU] | ORAL_CAPSULE | ORAL | 0 refills | Status: DC

## 2022-01-03 NOTE — Telephone Encounter (Signed)
Please advise 

## 2022-01-06 MED ORDER — VITAMIN D (ERGOCALCIFEROL) 1.25 MG (50000 UNIT) PO CAPS: 50000.0000 [IU] | ORAL_CAPSULE | ORAL | 0 refills | Status: DC

## 2022-01-14 ENCOUNTER — Telehealth: Payer: Self-pay | Admitting: *Deleted

## 2022-01-14 NOTE — Chronic Care Management (AMB) (Signed)
?  Care Management  ? ?Note ? ?01/14/2022 ?Name: Breanna Yang MRN: [id number] DOB: 04/16/1934 ? ?Breanna Yang is a 88 y.o. year old female who is a primary care patient of Birdie Riddle, Aundra Millet, MD and is actively engaged with the care management team. I reached out to [name] by phone today to assist with scheduling an initial visit with the Licensed Clinical Social Worker ? ?Follow up plan: ?Unsuccessful telephone outreach attempt made. A HIPAA compliant phone message was left for the patient providing contact information and requesting a return call.  ?The care management team will reach out to the patient again over the next 7 days.  ?If patient returns call to provider office, please advise to call [place] at [phone]. ? ?Laverda Sorenson  ?Care Guide, Embedded Care Coordination ?[place]  Care Management  ?Direct Dial: [phone] ? ?

## 2022-01-14 NOTE — Chronic Care Management (AMB) (Signed)
?  Care Management  ? ?Note ? ?01/14/2022 ?Name: Breanna Yang MRN: [id number] DOB: 1933-11-16 ? ?Breanna Yang is a 88 y.o. year old female who is a primary care patient of Birdie Riddle, Aundra Millet, MD. I reached out to [name] by phone today offer care coordination services.  ? ?[name] was given information about care management services today including:  ?Care management services include personalized support from designated clinical staff supervised by her physician, including individualized plan of care and coordination with other care providers ?24/7 contact phone numbers for assistance for urgent and routine care needs. ?The patient may stop care management services at any time by phone call to the office staff. ? ?Daughter Roxy Horseman verbally agreed to assistance and services provided by embedded care coordination/care management team today. ? ?Follow up plan: ?Telephone appointment with care management team member scheduled for:01/20/22 ? ?Laverda Sorenson  ?Care Guide, Embedded Care Coordination ?[place]  Care Management  ?Direct Dial: [phone] ? ?

## 2022-01-14 NOTE — Chronic Care Management (AMB) (Signed)
?  Care Management  ? ?Outreach Note ? ?01/14/2022 ?Name: Breanna Yang MRN: [id number] DOB: 02-Jan-1934 ? ?Referred by: Midge Minium, MD ?Reason for referral : Care Coordination (Outreach to schedule with SW ) ? ? ?A telephone outreach was attempted today. The patient was referred to the case management team for assistance with care management and care coordination.  ? ?Follow Up Plan:  ?The care management team will reach out to the patient again over the next 7 days.  ?If patient returns call to provider office, please advise to call [place]* at [phone].* ? ?Laverda Sorenson  ?Care Guide, Embedded Care Coordination ?[place]  Care Management  ?Direct Dial: [phone] ? ?

## 2022-01-16 ENCOUNTER — Telehealth: Payer: Self-pay | Admitting: *Deleted

## 2022-01-16 ENCOUNTER — Telehealth: Payer: Medicare HMO | Admitting: *Deleted

## 2022-01-16 NOTE — Telephone Encounter (Addendum)
?  Care Management  ? ?Follow Up Note ? ? ?01/16/2022 ? ?Name: Breanna Yang MRN: [id number] DOB: [birth year] ? ?Referred By: Midge Minium, MD ? ?Reason for Referral:  Chronic Care Management Needs in Patient with Gait Instability, Osteoporosis, Vascular Dementia without Behavioral Disturbance, and Hypertension. ? ?An unsuccessful telephone outreach was attempted today. The patient was referred to the case management team for assistance with care management and care coordination. A HIPAA compliant message was left on voicemail, providing contact information, encouraging patient to return CSW's call at her earliest convenience.  CSW will make another initial telephone outreach call attempt within the next 5-7 business days, if a return call is not received from patient in the meantime. ? ?Follow-Up Plan:  01/20/2022 at 12:00 pm ? ?Nat Christen LCSW ?Licensed Clinical Social Worker ?[facility] ?[phone]  ?

## 2022-01-17 ENCOUNTER — Telehealth: Payer: Self-pay

## 2022-01-17 NOTE — Telephone Encounter (Signed)
Can you recommend anything without seeing the patient at this time  ?

## 2022-01-17 NOTE — Telephone Encounter (Signed)
Daughter asked if someone would call her back.  ?

## 2022-01-17 NOTE — Telephone Encounter (Signed)
Daughter is calling in stating that her mom has progressively gotten worse, she is very restless, wondering out in the streets, and is being aggressive to her sister. Daughter is asking if there is anything that can help her calm down.  ?

## 2022-01-17 NOTE — Telephone Encounter (Signed)
If pt is being aggressive and wandering, she needs to be evaluated.  This can be done at [facility] or [facility].  It could be related to her dementia but she may also have something else going on like a UTI that could be causing these changes.  She needs to be seen. ?

## 2022-01-17 NOTE — Telephone Encounter (Signed)
Daughter [name] was informed of the advising from Dr Birdie Riddle and became very upset as she states theres no reason she needs to be seen as she was just seen for physical and these are long standing issues, I re-informed that this is Dr Rande Lawman recommendation given symptoms and concern and pt daughter was very unhappy with this but stated she would see what she could do  ?

## 2022-01-20 ENCOUNTER — Ambulatory Visit (INDEPENDENT_AMBULATORY_CARE_PROVIDER_SITE_OTHER): Payer: Medicare HMO | Admitting: *Deleted

## 2022-01-20 DIAGNOSIS — I10 Essential (primary) hypertension: Secondary | ICD-10-CM

## 2022-01-20 DIAGNOSIS — F0150 Vascular dementia without behavioral disturbance: Secondary | ICD-10-CM

## 2022-01-20 DIAGNOSIS — E785 Hyperlipidemia, unspecified: Secondary | ICD-10-CM

## 2022-01-20 DIAGNOSIS — M810 Age-related osteoporosis without current pathological fracture: Secondary | ICD-10-CM

## 2022-01-20 DIAGNOSIS — R2681 Unsteadiness on feet: Secondary | ICD-10-CM

## 2022-01-20 DIAGNOSIS — R296 Repeated falls: Secondary | ICD-10-CM

## 2022-01-20 DIAGNOSIS — R413 Other amnesia: Secondary | ICD-10-CM

## 2022-01-20 DIAGNOSIS — G9389 Other specified disorders of brain: Secondary | ICD-10-CM

## 2022-01-20 NOTE — Patient Instructions (Signed)
Visit Information  ? ?Thank you for taking time to visit with me today. Please don't hesitate to contact me if I can be of assistance to you before our next scheduled telephone appointment. ? ?Following are the goals we discussed today:  ?Patient Goals/Self-Care Activities:  ?Daughter will work with LCSW, on a bi-weekly basis, in an effort to secure 24 hour care and supervision for you in the home.     ?Daughter agreed to periodically follow-up with Rosealee Albee, Social Worker II with the [place] [facility] [phone]), to check the status of your name on the waiting list to receive In-Home Aide Services.   ?Daughter will review the following list of agencies and resources, and begin contacting agencies of interest, to [employer], availability, flexibility, credentialing, etc.       ?~ [place] ?~ [place] ?~ [place] Providers ?~ [place] ?Daughter will receive the following brochures, to become more knowledgeable about resources, services, and activities offered to seniors in your community: ?~ Development worker, community of [employer]  ?~ [place]  ?Daughter encouraged to contact LCSW (# [phone]) directly, if she has questions, needs assistance, or if additional social work needs are identified between now and our next scheduled telephone outreach call. ?Remember: ?Always use handrails on the stairs. ?Always use your cane or walker when ambulating. ?Always wear your glasses, especially at night or in areas not well lit.  ?Follow Up Plan: LCSW will follow-up with patient's daughter by telephone on 01/30/2022 at 10:45 am. ? ?Please call the care guide team at [phone] if you need to cancel or reschedule your appointment.  ? ?If you are experiencing a Mental Health or [place] or need someone to talk to, please call the Suicide and  Crisis Lifeline: 988 ?call the [place] National Suicide Prevention Lifeline: 430-207-5888 or TTY: (747) 084-7927 TTY 8487762376) to talk to a trained counselor ?call 1-800-273-TALK (toll free, 24 hour hotline) ?go to [facility] [address] [place] [phone]) ?call the [facility]: [phone] ?call 911  ? ?Following is a copy of your full care plan:  ?Care Plan : [place]  ?Updates made by Francis Gaines, LCSW since 01/20/2022 12:00 AM  ?  ? ?Problem: Protect My Health By Receiving Care and Supervision in the Home.   ?Priority: High  ?  ? ?Goal: Protect My Health By Receiving Care and Supervision in the Home.   ?Start Date: 01/20/2022  ?Expected End Date: 04/21/2022  ?This Visit's Progress: On track  ?Priority: High  ?Note:   ?Current Barriers:   ?Patient with Gait Instability, Frequent Falls, Osteoporosis, and Vascular Dementia without Behavioral Disturbance, needs Support, Education, Resources, Referrals, Advocacy, and Care Coordination, to resolve unmet personal care needs in the home. ?Patient is unable to self-administer medications as prescribed, or consistently perform ADL's/IADL's independently. ?Patient is now experiencing Behavioral Disturbances, in addition to Vascular Dementia, requiring 24 hour care and supervision. ?Patient and daughter lack knowledge of available community agencies and resources. ?Patient adamantly refuses to leave her home, to reside with daughter or sister, or receive long-term memory care assisted living facility placement. ?Clinical Goals:  ?Patient and daughter will work with LCSW, in an effort to secure 24 hour care and supervision in the home.   ?Patient will have [place] in place, either through [facility], Social  Worker II with the [place] Program, or through a private agency of choice. ?Patient and daughter will work with  LCSW and Rosealee Albee, Education officer, museum II with the [place] Program, to coordinate care for [employer]. ?Patient will demonstrate improved health management independence as evidenced by having [place] in place. ?Interventions: ?Patient's daughter interviewed and appropriate assessments performed. ?Collaboration with Primary Care Provider, [name] regarding development and update of comprehensive plan of care, as evidenced by provider attestation and co-signature. ?Inter-disciplinary care team collaboration (see longitudinal plan of care). ?Clinical Interventions: ?Referral placed to Rosealee Albee, Social Worker II with the [facility], on behalf of patient. ?LCSW collaboration with Rosealee Albee, Social Worker II with the [place] Program, to verify receipt of completed and signed application, as well as to confirm position on waiting list.   ?Identified resources and durable medical equipment needed in the home to improve safety and promote independence.  ?Problem Solving/Task Centered Solutions Developed. ?Health Education Discussed. ?Quality of Sleep Assessed and Sleep Hygiene Techniques Promoted. ?Caregiver Stress Acknowledged. ?Consideration of In-Home [employer] Encouraged. ?Discussed plans with patient's daughter for ongoing care management follow-up, and provided direct contact information for care management team. ?Assisted patient's daughter with obtaining information about health plan benefits through [facility], and confirmed no [employer] or long-term care [employer].   ?Provided education to patient's daughter regarding level of care options (I.e Senior Living, Assisted Living, Memory Care), but daughter refused.   ?Assessed needs, level of  care concerns, basic eligibility, and provided education on [employer] application process. ?Patient Goals/Self-Care Activities:  ?Daughter will work with LCSW, on a bi-weekly basis, in an effort to secure 24 hour care and supervision for you in the home.     ?Daughter agreed to periodically follow-up with Rosealee Albee, Social Worker II with the [place] [facility] [phone]), to check the status of your name on the waiting list to receive In-Home Aide Services.   ?Daughter will review the following list of agencies and resources, and begin contacting agencies of interest, to [employer], availability, flexibility, credentialing, etc.       ?~ [place] ?~ [place] ?~ [place] Providers ?~ [place] ?Daughter will receive the following brochures, to become more knowledgeable about resources, services, and activities offered to seniors in your community: ?~ Development worker, community of [employer]  ?~ [place]  ?Daughter encouraged to contact LCSW (# [phone]) directly, if she has questions, needs assistance, or if additional social work needs are identified between now and our next scheduled telephone outreach call. ?Remember: ?Always use handrails on the stairs. ?Always use your cane or walker when ambulating. ?Always wear your glasses, especially at night or in areas not well lit.  ?Follow Up Plan: LCSW will follow-up with patient's daughter by telephone on 01/30/2022 at 10:45 am. ?  ? ? ?Consent to CCM Services: ?Ms. Domingos was given information about Chronic Care Management services including:  ?CCM service includes personalized support from designated clinical staff supervised by her physician, including individualized plan of care and coordination with other care providers ?24/7 contact phone numbers for assistance for urgent and routine  care needs. ?Service will only be billed when office clinical staff spend 20 minutes or more in a month to coordinate care. ?Only one practitioner may furnish and bill the service in a calendar month. ?The pa

## 2022-01-20 NOTE — Chronic Care Management (AMB) (Signed)
?Chronic Care Management  ? ? Clinical Social Work Note ? ?01/20/2022 ?Name: Breanna Yang MRN: [id number] DOB: [birth year] ? ?ANALEISE Yang is a 88 y.o. year old female who is a primary care patient of Birdie Riddle, Aundra Millet, MD. The CCM team was consulted to assist the patient with chronic disease management and/or care coordination needs related to: [employer], Level of Care Concerns, and Caregiver Stress.  ? ?Engaged with patient's daughter by telephone for initial visit in response to provider referral for social work chronic care management and care coordination services.  ? ?Consent to Services:  ?The patient was given information about Chronic Care Management services, agreed to services, and gave verbal consent prior to initiation of services.  Please see initial visit note for detailed documentation.  ? ?Patient agreed to services and consent obtained.  ? ?Assessment: Review of patient past medical history, allergies, medications, and health status, including review of relevant consultants reports was performed today as part of a comprehensive evaluation and provision of chronic care management and care coordination services.    ? ?SDOH (Social Determinants of Health) assessments and interventions performed:  ?SDOH Interventions   ? ?Flowsheet Row Most Recent Value  ?SDOH Interventions   ?Food Insecurity Interventions Intervention Not Indicated  ?Financial Strain Interventions Intervention Not Indicated  ?Housing Interventions Intervention Not Indicated  ?Intimate Partner Violence Interventions Intervention Not Indicated  ?Physical Activity Interventions Intervention Not Indicated  ?Stress Interventions Offered [employer], Provide Counseling  ?Social Connections Interventions Intervention Not Indicated  ?Transportation Interventions Intervention Not Indicated  ?Depression Interventions/Treatment  Medication, Counseling, Currently on Treatment  ? ?  ?  ? ?Advanced Directives  Status: See Care Plan for related entries. ? ?CCM Care Plan ? ?No Known Allergies ? ?Outpatient Encounter Medications as of 01/20/2022  ?Medication Sig  ? Cholecalciferol (VITAMIN D) 2000 units tablet Take 2,000 Units by mouth daily.  ? DERMA-SMOOTHE/FS SCALP 0.01 % OIL Apply 1 a small amount to affected area as directed  Apply to scalp leave in qhs, wash out in the am  ? ENSURE (ENSURE) Take 237 mLs by mouth at bedtime.  ? ketoconazole (NIZORAL) 2 % shampoo Shampoo with  a small amount as directed three times a week  ? Multiple Vitamin (MULTIVITAMIN) capsule Take 1 capsule by mouth daily.  ? PARoxetine (PAXIL) 20 MG tablet TAKE 1 TABLET BY MOUTH EVERY DAY  ? Vitamin D, Ergocalciferol, (DRISDOL) 1.25 MG (50000 UNIT) CAPS capsule Take 1 capsule (50,000 Units total) by mouth every 7 (seven) days.  ? Vitamin D, Ergocalciferol, (DRISDOL) 1.25 MG (50000 UNIT) CAPS capsule Take 1 capsule (50,000 Units total) by mouth every 7 (seven) days.  ? ?No facility-administered encounter medications on file as of 01/20/2022.  ? ? ?Patient Active Problem List  ? Diagnosis Date Noted  ? Weight loss 12/14/2019  ? History of COVID-19 10/31/2019  ? Vascular dementia without behavioral disturbance ([place]) 10/13/2017  ? Hx of syncope 04/06/2017  ? Gait instability 04/06/2017  ? Physical exam 01/06/2017  ? GERD (gastroesophageal reflux disease) 11/13/2016  ? Encephalomalacia 11/13/2016  ? HTN (hypertension) 07/04/2016  ? Hyperlipidemia 07/04/2016  ? Osteoporosis 07/04/2016  ? ? ?Conditions to be addressed/monitored:  Vascular Dementia Without Behavioral Disturbance, Gait Instability, and Hypertension.  Level of Care Concerns, Medication Procurement, ADL/IADL Limitations, Mental Health Concerns, Social Isolation, Limited Access to Caregiver, Cognitive Deficits, Memory Deficits, and Lacks Knowledge of [employer]. ? ?Care Plan : LCSW Plan of Care  ?Updates made by Francis Gaines,  LCSW since 01/20/2022 12:00 AM  ?  ? ?Problem: Protect  My Health By Receiving Care and Supervision in the Home.   ?Priority: High  ?  ? ?Goal: Protect My Health By Receiving Care and Supervision in the Home.   ?Start Date: 01/20/2022  ?Expected End Date: 04/21/2022  ?This Visit's Progress: On track  ?Priority: High  ?Note:   ?Current Barriers:   ?Patient with Gait Instability, Frequent Falls, Osteoporosis, and Vascular Dementia without Behavioral Disturbance, needs Support, Education, Resources, Referrals, Advocacy, and Care Coordination, to resolve unmet personal care needs in the home. ?Patient is unable to self-administer medications as prescribed, or consistently perform ADL's/IADL's independently. ?Patient is now experiencing Behavioral Disturbances, in addition to Vascular Dementia, requiring 24 hour care and supervision. ?Patient and daughter lack knowledge of available community agencies and resources. ?Patient adamantly refuses to leave her home, to reside with daughter or sister, or receive long-term memory care assisted living facility placement. ?Clinical Goals:  ?Patient and daughter will work with LCSW, in an effort to secure 24 hour care and supervision in the home.   ?Patient will have [place] in place, either through [facility], Education officer, museum II with the [place] Program, or through a private agency of choice. ?Patient and daughter will work with LCSW and Rosealee Albee, Education officer, museum II with the [place] Program, to coordinate care for [employer]. ?Patient will demonstrate improved health management independence as evidenced by having [place] in place. ?Interventions: ?Patient's daughter interviewed and appropriate assessments performed. ?Collaboration with Primary Care Provider, [name] regarding development and update of comprehensive plan of care, as evidenced by provider  attestation and co-signature. ?Inter-disciplinary care team collaboration (see longitudinal plan of care). ?Clinical Interventions: ?Referral placed to Rosealee Albee, Social Worker II with the [place], on behalf of patient. ?LCSW collaboration with Rosealee Albee, Social Worker II with the [place] Program, to verify receipt of completed and signed application, as well as to confirm position on waiting list.   ?Identified resources and durable medical equipment needed in the home to improve safety and promote independence.  ?Problem Solving/Task Centered Solutions Developed. ?Health Education Discussed. ?Quality of Sleep Assessed and Sleep Hygiene Techniques Promoted. ?Caregiver Stress Acknowledged. ?Consideration of In-Home [employer] Encouraged. ?Discussed plans with patient's daughter for ongoing care management follow-up, and provided direct contact information for care management team. ?Assisted patient's daughter with obtaining information about health plan benefits through [facility], and confirmed no [employer] or long-term care [employer].   ?Provided education to patient's daughter regarding level of care options (I.e Senior Living, Assisted Living, Memory Care), but daughter refused.   ?Assessed needs, level of care concerns, basic eligibility, and provided education on [employer] application process. ?Patient Goals/Self-Care Activities:  ?Daughter will work with LCSW, on a bi-weekly basis, in an effort to secure 24 hour care and supervision for you in the home.     ?Daughter agreed to periodically follow-up with Rosealee Albee, Social Worker II with the [place] Program [phone]), to check the status of your name on the waiting list to receive In-Home Aide  Services.   ?Daughter will review the following list of agencies and resources, and begin contacting agencies of  interest, to check pricing, availability, flexibility, credentialing, etc.       ?~ [place]

## 2022-01-26 DIAGNOSIS — I10 Essential (primary) hypertension: Secondary | ICD-10-CM

## 2022-01-26 DIAGNOSIS — F0150 Vascular dementia without behavioral disturbance: Secondary | ICD-10-CM

## 2022-01-26 DIAGNOSIS — M810 Age-related osteoporosis without current pathological fracture: Secondary | ICD-10-CM

## 2022-01-26 DIAGNOSIS — E785 Hyperlipidemia, unspecified: Secondary | ICD-10-CM

## 2022-01-30 ENCOUNTER — Ambulatory Visit (INDEPENDENT_AMBULATORY_CARE_PROVIDER_SITE_OTHER): Payer: Medicare HMO | Admitting: *Deleted

## 2022-01-30 ENCOUNTER — Telehealth: Payer: Medicare HMO

## 2022-01-30 DIAGNOSIS — R296 Repeated falls: Secondary | ICD-10-CM

## 2022-01-30 DIAGNOSIS — E785 Hyperlipidemia, unspecified: Secondary | ICD-10-CM

## 2022-01-30 DIAGNOSIS — F0150 Vascular dementia without behavioral disturbance: Secondary | ICD-10-CM

## 2022-01-30 DIAGNOSIS — I10 Essential (primary) hypertension: Secondary | ICD-10-CM

## 2022-01-30 DIAGNOSIS — R2681 Unsteadiness on feet: Secondary | ICD-10-CM

## 2022-01-30 DIAGNOSIS — R413 Other amnesia: Secondary | ICD-10-CM

## 2022-01-30 NOTE — Chronic Care Management (AMB) (Signed)
?Chronic Care Management  ? ? Clinical Social Work Note ? ?01/30/2022 ?Name: Breanna Yang MRN: [id number] DOB: April 15, 1934 ? ?Breanna Yang is a 88 y.o. year old female who is a primary care patient of Tabori, Breanna Millet, MD. The CCM team was consulted to assist the patient with chronic disease management and/or care coordination needs related to: [employer], Level of Care Concerns, Mental Health Counseling and Resources, and Caregiver Stress.  ? ?Engaged with patient's daughter by telephone for follow up visit in response to provider referral for social work chronic care management and care coordination services.  ? ?Consent to Services:  ?The patient was given information about Chronic Care Management services, agreed to services, and gave verbal consent prior to initiation of services.  Please see initial visit note for detailed documentation.  ? ?Patient agreed to services and consent obtained.  ? ?Assessment: Review of patient past medical history, allergies, medications, and health status, including review of relevant consultants reports was performed today as part of a comprehensive evaluation and provision of chronic care management and care coordination services.    ? ?SDOH (Social Determinants of Health) assessments and interventions performed:   ? ?Advanced Directives Status: Not addressed in this encounter. ? ?CCM Care Plan ? ?No Known Allergies ? ?Outpatient Encounter Medications as of 01/30/2022  ?Medication Sig  ? Cholecalciferol (VITAMIN D) 2000 units tablet Take 2,000 Units by mouth daily.  ? DERMA-SMOOTHE/FS SCALP 0.01 % OIL Apply 1 a small amount to affected area as directed  Apply to scalp leave in qhs, wash out in the am  ? ENSURE (ENSURE) Take 237 mLs by mouth at bedtime.  ? ketoconazole (NIZORAL) 2 % shampoo Shampoo with  a small amount as directed three times a week  ? Multiple Vitamin (MULTIVITAMIN) capsule Take 1 capsule by mouth daily.  ? PARoxetine (PAXIL) 20 MG tablet TAKE 1  TABLET BY MOUTH EVERY DAY  ? Vitamin D, Ergocalciferol, (DRISDOL) 1.25 MG (50000 UNIT) CAPS capsule Take 1 capsule (50,000 Units total) by mouth every 7 (seven) days.  ? Vitamin D, Ergocalciferol, (DRISDOL) 1.25 MG (50000 UNIT) CAPS capsule Take 1 capsule (50,000 Units total) by mouth every 7 (seven) days.  ? ?No facility-administered encounter medications on file as of 01/30/2022.  ? ? ?Patient Active Problem List  ? Diagnosis Date Noted  ? Weight loss 12/14/2019  ? History of COVID-19 10/31/2019  ? Vascular dementia without behavioral disturbance ([place]) 10/13/2017  ? Hx of syncope 04/06/2017  ? Gait instability 04/06/2017  ? Physical exam 01/06/2017  ? GERD (gastroesophageal reflux disease) 11/13/2016  ? Encephalomalacia 11/13/2016  ? HTN (hypertension) 07/04/2016  ? Hyperlipidemia 07/04/2016  ? Osteoporosis 07/04/2016  ? ? ?Conditions to be addressed/monitored:  Vascular Dementia and Caregiver Stress.  Limited Social Support, Level of Care Concerns, ADL/IADL Limitations, Mental Health Concerns, Limited Access to Caregiver, Cognitive Deficits, Memory Deficits, and Lacks Knowledge of [employer]. ? ?Care Plan : LCSW Plan of Care  ?Updates made by Francis Gaines, LCSW since 01/30/2022 12:00 AM  ?  ? ?Problem: Protect My Health By Receiving Care and Supervision in the Home. Resolved 01/30/2022  ?Priority: High  ?  ? ?Goal: Protect My Health By Receiving Care and Supervision in the Home. Completed 01/30/2022  ?Start Date: 01/20/2022  ?Expected End Date: 01/30/2022  ?This Visit's Progress: On track  ?Recent Progress: On track  ?Priority: High  ?Note:   ?Current Barriers:   ?Patient with Gait Instability, Frequent Falls, Osteoporosis, and Vascular Dementia  without Behavioral Disturbance, needs Support, Education, Resources, Referrals, Advocacy, and Care Coordination, to resolve unmet personal care needs in the home. ?Patient is unable to self-administer medications as prescribed, or consistently perform ADL's/IADL's  independently. ?Patient is now experiencing Behavioral Disturbances, in addition to Vascular Dementia, requiring 24 hour care and supervision. ?Patient and daughter lack knowledge of available community agencies and resources. ?Patient adamantly refuses to leave her home, to reside with daughter or sister, or receive long-term memory care assisted living facility placement. ?Clinical Goals:  ?Patient and daughter will work with LCSW, in an effort to secure 24 hour care and supervision in the home.   ?Patient will have [place] in place, either through [facility], Education officer, museum II with the [place] [facility], or through a private agency of choice. ?Patient and daughter will work with LCSW and Rosealee Albee, Education officer, museum II with the [place] [facility], to coordinate care for [employer]. ?Patient will demonstrate improved health management independence as evidenced by having [place] in place. ?Interventions: ?Collaboration with Primary Care Provider, [name] regarding development and update of comprehensive plan of care, as evidenced by provider attestation and co-signature. ?Inter-disciplinary care team collaboration (see longitudinal plan of care). ?Clinical Interventions: ?LCSW collaboration with Rosealee Albee, Social Worker II with the [place] [facility], to verify receipt of completed and signed application, as well as to confirm position on waiting list.   ?Problem Solving/Task Centered Solutions Developed. ?Emotional Support Provided. ?Verbalization of Feelings Encouraged. ?Caregiver Stress Acknowledged. ?Consideration of In-Home [employer] Encouraged. ?Patient Goals/Self-Care Activities:   ?Daughter will periodically follow-up with Rosealee Albee, Social Worker II with the  [place] [facility] [phone]), to check the status of your name on the waiting list to receive In-Home Aide Services.   ?Daughter will review the following list of agencies and resources, and begin contacting agencies of interest, to [employer], availability, flexibility, credentialing, etc.       ?~ [place] ?~ [place] ?~ [place] [facility] ?~ [place] ?Daughter will receive the following brochures, to become more knowledgeable about resources, services, and activities offered to seniors in your community: ?~ Development worker, community of [employer]  ?~ [place]  ?Daughter encouraged to contact LCSW (# [phone]) directly, if she has questions, needs assistance, or if additional social work needs are identified in the near future.   ?Follow-Up Plan:  Daughter denies need for follow-up. ?  ?Nat Christen LCSW ?Licensed Clinical Social Worker ?[facility] ?[phone]  ? ?

## 2022-01-30 NOTE — Patient Instructions (Signed)
Visit Information ? ?Thank you for taking time to visit with me today. Please don't hesitate to contact me if I can be of assistance to you before our next scheduled telephone appointment. ? ?Following are the goals we discussed today:  ?Patient Goals/Self-Care Activities:   ?Daughter will periodically follow-up with Rosealee Albee, Social Worker II with the [place] [facility] [phone]), to check the status of your name on the waiting list to receive In-Home Aide Services.   ?Daughter will review the following list of agencies and resources, and begin contacting agencies of interest, to [employer], availability, flexibility, credentialing, etc.       ?~ [place] ?~ [place] ?~ [place] [facility] ?~ [place] ?Daughter will receive the following brochures, to become more knowledgeable about resources, services, and activities offered to seniors in your community: ?~ Development worker, community of [employer]  ?~ [place]  ?Daughter encouraged to contact LCSW (# [phone]) directly, if she has questions, needs assistance, or if additional social work needs are identified in the near future.   ?Follow-Up Plan:  Daughter denies need for follow-up. ? ?Please call the care guide team at [phone] if you need to cancel or reschedule your appointment.  ? ?If you are experiencing a Mental Health or [place] or need someone to talk to, please call the Suicide and Crisis Lifeline: 988 ?call the [place] National Suicide Prevention Lifeline: (617)038-5015 or TTY: 8088384875 TTY 724-593-3245) to talk to a trained counselor ?call 1-800-273-TALK (toll free, 24 hour hotline) ?go to [facility] [address] [place] [phone]) ?call the [facility]: [phone] ?call 911  ? ?Patient verbalizes  understanding of instructions and care plan provided today and agrees to view in [place]. Active MyChart status confirmed with patient.   ? ?Nat Christen LCSW ?Licensed Clinical Social Worker ?[facility] ?[phone]  ?

## 2022-02-04 NOTE — Assessment & Plan Note (Signed)
Check Vit D and replete prn. 

## 2022-02-04 NOTE — Assessment & Plan Note (Signed)
At this time, pt is not in need of a statin or fenofibrate.  Given her age and dementia, I'm not sure these are providing any benefit and are just posing another area of confusion.  Will stop meds. ?

## 2022-02-04 NOTE — Assessment & Plan Note (Signed)
Pt is down 16 lbs since her visit in August.  Stressed the need for regular eating.  Told family that even if she is only taking a few bites, this is ok as long as she does it frequently throughout the day.  Encouraged Boost or Ensure shakes 1-2x/day.  Will follow. ?

## 2022-02-04 NOTE — Assessment & Plan Note (Signed)
Pt is currently hypotensive.  Based on this, will stop Metoprolol to avoid falls.  Pt and sister expressed understanding and this was provided in writing on AVS ?

## 2022-02-04 NOTE — Assessment & Plan Note (Signed)
Deteriorating.  Pt remains pleasant but she is not aware of what she doesn't know.  Will confabulate and tell people what she thinks they want to hear.  Daughter sent a MyChart message stating concerns that pt is wandering- but pt and sister deny this.  Pt has been trying to administer her own meds but has not been taking them correctly.  Pt and sister both agree that pt is better off living w/ the sister.  Pt states there has been no mention of NH or ALF.  Will stop multiple meds to make pt's regimen less confusing.  This info was provided in writing on AVS ?

## 2022-02-06 ENCOUNTER — Ambulatory Visit (INDEPENDENT_AMBULATORY_CARE_PROVIDER_SITE_OTHER): Payer: Medicare HMO | Admitting: Family Medicine

## 2022-02-06 ENCOUNTER — Encounter: Payer: Self-pay | Admitting: Family Medicine

## 2022-02-06 VITALS — BP 108/68 | HR 88 | Temp 97.8°F | Resp 16 | Ht 65.0 in | Wt 105.2 lb

## 2022-02-06 DIAGNOSIS — R300 Dysuria: Secondary | ICD-10-CM

## 2022-02-06 LAB — POCT URINALYSIS DIPSTICK
Bilirubin, UA: NEGATIVE
Blood, UA: NEGATIVE
Glucose, UA: NEGATIVE
Ketones, UA: NEGATIVE
Nitrite, UA: NEGATIVE
Protein, UA: NEGATIVE
Spec Grav, UA: 1.01 (ref 1.010–1.025)
Urobilinogen, UA: 0.2 E.U./dL
pH, UA: 6 (ref 5.0–8.0)

## 2022-02-06 NOTE — Patient Instructions (Signed)
Please try and collect a urine and bring it back. ?If you can't bring the urine back right away, please put it in the fridge ?Make sure you are drinking plenty of water ?Call with any questions or concerns ?Happy Mother's Day! ?

## 2022-02-06 NOTE — Addendum Note (Signed)
Addended byPauline Good, Asier Desroches on: 02/06/2022 02:40 PM ? ? Modules accepted: Orders ? ?

## 2022-02-06 NOTE — Progress Notes (Signed)
? ?  Subjective:  ? ? Patient ID: Breanna Yang, female    DOB: 23-Jul-1934, 88 y.o.   MRN: [id number] ? ?HPI ?Burning w/ urination- sister reports pt complained of urinary burning last week and she had urinary hesitancy.  Sister states pt has been urinating regularly this week.  No fevers.  Pt reports she 'seldom' has pain w/ urination.  No blood in urine.  No back pain.  Pt has had 'diarrhea' for the 'last couple of weeks'.  She has had multiple accidents.   ? ? ?Review of Systems ?For ROS see HPI  ?   ?Objective:  ? Physical Exam ?Vitals reviewed.  ?Constitutional:   ?   General: She is not in acute distress. ?   Appearance: Normal appearance. She is not ill-appearing.  ?HENT:  ?   Head: Normocephalic and atraumatic.  ?Pulmonary:  ?   Effort: Pulmonary effort is normal. No respiratory distress.  ?Abdominal:  ?   General: Abdomen is flat. There is no distension.  ?   Palpations: Abdomen is soft.  ?   Tenderness: There is no abdominal tenderness. There is no guarding or rebound.  ?Skin: ?   General: Skin is warm and dry.  ?Neurological:  ?   Mental Status: She is alert.  ?   Comments: Pt is very pleasant but does not know why she is here and cannot answer any questions about her sxs  ?Psychiatric:     ?   Mood and Affect: Mood normal.     ?   Behavior: Behavior normal.  ? ? ? ? ? ?   ?Assessment & Plan:  ?Dysuria- new.  Pt reportedly complained to her sister last week that she was having burning w/ urination and some hesitancy.  Sister reports bladder habits have been normal this week.  Pt was given 3 small bottles of water in attempt to get a urine sample but after being present in the office for nearly an hour, she was unable to go.  Pt was sent home w/ urine cup and wipes as well as a specimen bag.  Sister to return urine for UA and culture ? ?

## 2022-02-06 NOTE — Addendum Note (Signed)
Addended byPauline Good, Kelli Robeck on: 02/06/2022 02:51 PM ? ? Modules accepted: Orders ? ?

## 2022-02-07 LAB — URINE CULTURE
MICRO NUMBER:: 13385759
SPECIMEN QUALITY:: ADEQUATE

## 2022-02-08 ENCOUNTER — Encounter: Payer: Self-pay | Admitting: Family Medicine

## 2022-02-08 DIAGNOSIS — G9389 Other specified disorders of brain: Secondary | ICD-10-CM

## 2022-02-08 DIAGNOSIS — F0150 Vascular dementia without behavioral disturbance: Secondary | ICD-10-CM

## 2022-02-10 ENCOUNTER — Telehealth: Payer: Self-pay

## 2022-02-10 NOTE — Telephone Encounter (Signed)
Spoke w/ the daughter and advised of the urine results  ?

## 2022-02-10 NOTE — Telephone Encounter (Signed)
-----   Message from Midge Minium, MD sent at 02/10/2022  7:34 AM EDT ----- ?No evidence of UTI- great news! ?

## 2022-02-25 ENCOUNTER — Encounter: Payer: Self-pay | Admitting: Physician Assistant

## 2022-02-26 ENCOUNTER — Encounter: Payer: Self-pay | Admitting: Family Medicine

## 2022-02-26 DIAGNOSIS — F0150 Vascular dementia without behavioral disturbance: Secondary | ICD-10-CM

## 2022-02-27 ENCOUNTER — Ambulatory Visit: Payer: Medicare HMO

## 2022-02-27 ENCOUNTER — Ambulatory Visit: Payer: Medicare HMO | Admitting: Physician Assistant

## 2022-02-27 ENCOUNTER — Encounter: Payer: Self-pay | Admitting: Physician Assistant

## 2022-02-27 VITALS — BP 143/70 | HR 84 | Resp 18 | Ht 65.0 in | Wt 105.0 lb

## 2022-02-27 DIAGNOSIS — R296 Repeated falls: Secondary | ICD-10-CM

## 2022-02-27 DIAGNOSIS — G309 Alzheimer's disease, unspecified: Secondary | ICD-10-CM

## 2022-02-27 DIAGNOSIS — F01518 Vascular dementia, unspecified severity, with other behavioral disturbance: Secondary | ICD-10-CM | POA: Diagnosis not present

## 2022-02-27 DIAGNOSIS — R413 Other amnesia: Secondary | ICD-10-CM

## 2022-02-27 DIAGNOSIS — F02818 Dementia in other diseases classified elsewhere, unspecified severity, with other behavioral disturbance: Secondary | ICD-10-CM

## 2022-02-27 MED ORDER — MEMANTINE HCL 10 MG PO TABS: ORAL_TABLET | ORAL | 11 refills | Status: DC

## 2022-02-27 NOTE — Progress Notes (Addendum)
Sept 8 11:30   Assessment/Plan:     The patient is seen in neurologic consultation at the request of Birdie Riddle, Aundra Millet, MD for the evaluation of memory.  Breanna Yang is a very pleasant 88 y.o. 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  Neuropsychological testing in 06/2017 indicated mild dementia, likely vascular dementia with significant dysfunction in multiple domains, as well as evidence that cognitive impairment is interfering with her ability to manage complex tasks such as driving and managing finances. It was a predominantly frontal-subcortical dysfunction which is commonly seen in vascular dementia due to small vessel disease. Since that time patient lost to follow up, unclear reasons. Memory has declined since that time, today with a MoCA of 1/30. She has noticeable difficulty with fluency and comprehension, not responding accurately to what is being asked. In addition, there are confrontational episodes towards her daughter and sister. No hallucinations or paranoia. She does become disoriented and there is wandering behavior present.  Patient needs HHN/ALF care at this point for safety as she needs 24/7 monitoring.      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   CT brain with/without contrast to assess for underlying structural abnormality and assess vascular load  Will refer to a visit with our social worker, Arva Chafe Palladino for discussion about how to best help patient and family to obtain [facility] or ALF for safety and fall risk, as the patient is at the point where she needs close monitoring 24/7 care  Referral to PT/OT for strength and balance, frequent falls  Referral to Psychiatry  for management of mood disorder medicines Start Memantine 10 mg: Take 1 tablet (10 mg at night) for 2 weeks, then increase to 1 tablet (10 mg) twice a day. Side effects discussed   Folllow up in 3  months  Subjective:     The patient is accompanied by her sister who supplements the history.   How long did patient have memory difficulties?  For at least 5 years, worse over the last 6 months. She had lost to follow up for unclear reasons-sister said she was feeling "fine". She had not been on any memory medications. In fact she had been prescribed Aricept back then, but she has not taken it. "She has a hard time having the word coming out. She has forgotten the dates"-sister reports .  Patient lives with: her daughter during the day, and she spends the night with her sister but is still insistent to come to her daughter's house during the day. Sometimes she is alone and at that point she is at the risk of wandering off.  repeats oneself? Endorsed, more frequently over the last 6 months."I want to have breakfast" was repeated several times during this visit  Disoriented when walking into a room? Endorsed: "where are we at?" She asks her family frequently   Leaving objects in unusual places?  Patient and sister deny  Ambulates  with difficulty? SHe appears to be deconditioned, her daughter has bought her 3 different walkers and she refuses to use them. She is at risk for falls Recent falls?  Endorsed. "She has fallen several times in the yard when trying to do things she really shouldn't be doing". Falls occur  mostly when she bends down to pick flowers or something from the ground.. She never had PT/OT   Any head injuries?  "My head hurts in the top where I have a hole, and also in the back of the head sometimes" no vertigo, nausea, vomiting or dizziness, no syncope. History of seizures?   Patient denies   Wandering behavior? Daughter reports that recently she has began to wander around the  neighborhood and has even crossed [place] to walk to the store [phone]) "One time she was found in the woods .   Patient drives?   Patient no longer drives   Any mood changes such irritability agitation?  Daughter reports that she is combative towards her and her sister when she does not get her way. She "tells [place] foul words a lot" Any history of depression?:  Patient denies   Hallucinations? Sister Denies Paranoia?  Patient denies   Patient reports that he sleeps well without vivid dreams, REM behavior or sleepwalking   History of sleep apnea?  Patient denies   Any hygiene concerns?  Patient denies. Her hygiene is impeccable   Independent of bathing and dressing?  Endorsed, although sometimes she sleeps with her street clothes.   Does the patient needs help with medications?  Daughter is in charge Who is in charge of the finances?  Daughter is in charge    Any changes in appetite?  She don't eat like she did, she forgets to eat sometimes    Patient have trouble swallowing? Patient denies   Does the patient cook?  Patient denies   Any kitchen accidents such as leaving the stove on? Patient denies   Any headaches? Endorsed. "Since the aneurysm, takes tylenol sometimes and goes away"  Double vision? Patient denies   Any focal numbness or tingling?  Patient denies   Chronic back pain Patient denies   Unilateral weakness?  Patient denies   Any tremors?  Patient denies   Any history of anosmia?  Patient denies   Any incontinence of urine?  Patient denies   Any bowel dysfunction?   Patient denies   History of heavy alcohol intake?  Patient denies   History of heavy tobacco use?  Patient denies   Family history of dementia?  3 brothers with dementia  9 brothers and sisters. Widowed. She retired after having her aneurysm in 1985. She used to make wedding and prom dresses    CT head 11/05/16 Old posterior fossa aneurysm clipping on the right. Occipital craniectomy.  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 Right frontal encephalomalacia due to previous ventriculostomy. Atrophy and chronic small-vessel ischemic changes of the white matter, progressive since 2008. The ventricles are larger, but this is roughly in proportion to the progressive white matter disease.   Visit 10/07/2017, Dr. Candie Mile had the pleasure of seeing Breanna Yang in follow-up in the neurology clinic on 10/07/2017.  The patient was last seen 6 months ago for syncope, balance problems, and memory loss. She is accompanied by her daughter who helps supplement the history today.  Records and images were personally reviewed where available.  Since her last visit, she underwent Neuropsychological testing in 06/2017 which indicated mild dementia, likely vascular dementia. There was significant dysfunction in multiple domains, as well as evidence that cognitive impairment is interfering with her ability to manage complex tasks such as driving and managing finances. The stage of dementia, based on test results and current level of functioning, appears to  be mild stage. Her cognitive profile is reflective of predominantly frontal-subcortical dysfunction which is commonly seen in vascular dementia due to small vessel disease.    Her daughter had called our office prior to the visit stating she believes that her mother is not being honest with her doctors. She reported her mother was still driving and that nothing will make her stop. The patient stayed with her over the holiday and her daughter was shocked at the patient's decline. She was becoming more and more irate, she has thrown things at people and smacked things out of people's hands. Per phone note, the daughter thinks patient is "trying to get one over her doctors."    She reports that she is not driving. She went to the [facility] to have her driving test and was told she would be contacted, but has not heard back. She continues to live alone,  her daughter [name] calls her multiple times a day. She states it is very seldom that she ever forgets to do something. She has a checklist when she takes her medications. She takes Aleve about every morning for headaches. She feels off balance when in the yard, but denies any falls. She denies any further syncopal episodes since February 2018.   HPI 12/03/2016: This is an 88 yo RH woman with a history of hypertension, hyperlipidemia, brain aneurysm s/p clipping in the 1980s, presenting for evaluation of syncope, balance problems, and memory loss.    1. Syncope. She had a syncopal episode and was brought to the ER on 11/03/16. She states she had ran out of fuel and had no heat. Per ER notes, she was waving goodbye to family when she passed out with no prior warning symptoms. She denied any post-event confusion or focal weakness, no headache.She declined to go to the ER but went 2 days later due to unsteady gait and decreased appetite. She felt this was similar to when she was found to have an aneurysm in the past. She had reported a month history of weakness as well and mild headache. Vital signs were stable, pulse was 58 bpm. Bloodwork unremarkable. She had a head CT which I personally reviewed, prior occipital craniotomy seen, with encephalomalacia in the inferior cerebellum, right>left; chronic microvascular disease, old right frontal burr hole probably due to ventriculostomy; enlarged ventricles, felt to be in proportion with progressive white matter disease. She was discharged home in stable condition and denies any further similar symptoms. She denies any staring/unresponsive episodes, gaps in time, olfactory/gustatory hallucinations, deja vu, rising epigastric sensation, focal numbness/tingling/weakness, myoclonic jerks. She denies any dizziness, palpitations, chest pain, shortness of breath. She has not been driving since then.   2. Balance problems: She and her sister do noticed some balance issues, she  denies any falls but states she might wobble once in a while. She states she was told by her neurosurgeon that this may happen. She exercises and walks down her driveway. She denies any focal numbness/tingling/weakness, no neck/back pain, no bowel/bladder dysfunction.    3. Memory issues. She and her sister report her memory is good. She lives with her daughter who is in charge of bills. She denies any missed medications and states she writes them down. When she was driving, she denied getting lost. Her sister lives close by and sees her on a daily basis. She states her mood is great, her sister says "It's hateful," she is impatient and gets upset when her sister does not pick her up on time. Her  sister denies any personality changes. Her daughter sent a letter detailing her concerns. She has noticed the balance issues and states she has difficulties walking straight without staggering. With driving, she pulls out in front of traffic, often forgets directions, and is very nervous when she drives. She does not hear well, so her daughter is very worried about her driving. Her memory has declined greatly over the last year she has been living with her. She gets details confused or if someone recalls things differently or challenges her thoughts, she gets very hostile and angry. She has noticed an increase in her anxiety as well and she always seems to be on edge to those close to her. In the past 6 months, she forgets who people are and cannot recall conversations or even simple details and daily tasks. Her daughter takes care of all her finances, but she has become increasingly concerned because her mother is targeted daily by scammers and she is extremely confused about her finances. She personally thinks she needs to be listed incompetent to handle her own affairs. I reviewed [name] note as well, the patient did not recall [name] on her last visit, she has seen her several times in the past year. When  asked today about her PCP, she answers [name] is her PCP and she has seen her several times and loves her. 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 She has occasional headaches on the frontal or left temporal region since her brain surgery. She has horizontal diplopia without her glasses. No dizziness, dysarthria/dysphagia, neck/back pain, focal numbness/tingling/weakness, bowel/bladder dysfunction. No family history of dementia. Her father had a stroke. No history of concussions. She denies any alcohol use  No Known Allergies  Current Outpatient Medications  Medication Instructions   DERMA-SMOOTHE/FS SCALP 0.01 % OIL Apply 1 a small amount to affected area as directed  Apply to scalp leave in qhs, wash out in the am   ENSURE (ENSURE) 237 mLs, Oral, Daily at bedtime   ketoconazole (NIZORAL) 2 % shampoo Shampoo with  a small amount as directed three times a week   memantine (NAMENDA) 10 MG tablet Take 1 tablet (10 mg at night) for 2 weeks, then increase to 1 tablet (10 mg) twice a day   Multiple Vitamin (MULTIVITAMIN) capsule 1 capsule, Oral, Daily   PARoxetine (PAXIL) 20 MG tablet TAKE 1 TABLET BY MOUTH EVERY DAY   Vitamin D (Ergocalciferol) (DRISDOL) 50,000 Units, Oral, Every 7 days   Vitamin D (Ergocalciferol) (DRISDOL) 50,000 Units, Oral, Every 7 days   Vitamin D 2,000 Units, Oral, Daily     VITALS:   Vitals:   02/27/22 0757  BP: (!) 143/70  Pulse: 84  Resp: 18  SpO2: 96%  Weight: 105 lb (47.6 kg)  Height: '5\' 5"'$  (1.651 m)      02/06/2022   10:43 AM 01/20/2022    3:10 PM 01/01/2022    1:04 PM 05/31/2021    2:11 PM 01/21/2021    1:01 PM  Depression screen PHQ 2/9  Decreased Interest 0 0 0 2 0  Down, Depressed, Hopeless 0 0 0 3 0  PHQ - 2 Score 0 0 0 5 0  Altered sleeping 0 0 0 0   Tired, decreased energy 0 '1 1 1   '$ Change in appetite 0 '1 2 2   '$ Feeling bad or failure about yourself  0 0 0  0   Trouble  concentrating 0 '3 2 3   '$ Moving slowly or fidgety/restless 0 '3 3 3   '$ Suicidal thoughts 0 0 0 0   PHQ-9 Score 0 '8 8 14   '$ Difficult doing work/chores Not difficult at all Somewhat difficult Somewhat difficult      PHYSICAL EXAM   HEENT:  Normocephalic, atraumatic. The mucous membranes are moist. The superficial temporal arteries are without ropiness or tenderness. Cardiovascular: Regular rate and rhythm. Lungs: Clear to auscultation bilaterally. Neck: There are no carotid bruits noted bilaterally.  NEUROLOGICAL:    02/27/2022    9:00 AM 12/03/2016    2:00 PM  Montreal Cognitive Assessment   Visuospatial/ Executive (0/5) 0 5  Naming (0/3) 0 2  Attention: Read list of digits (0/2) 1 2  Attention: Read list of letters (0/1) 0 1  Attention: Serial 7 subtraction starting at 100 (0/3) 0 2  Language: Repeat phrase (0/2) 0 2  Language : Fluency (0/1) 0 0  Abstraction (0/2) 0 1  Delayed Recall (0/5) 0 4  Orientation (0/6) 0 6  Total 1 25  Adjusted Score (based on education) 1        04/06/2017   10:00 AM 01/15/2017    9:27 AM  MMSE - Mini Mental State Exam  Orientation to time 5 5  Orientation to Place 5 5  Registration 3 3  Attention/ Calculation 4 4  Recall 1 2  Language- name 2 objects 2 2  Language- repeat 1 1  Language- follow 3 step command 3 3  Language- read & follow direction 1 1  Write a sentence 1 1  Copy design 1 0  Total score 27 27     Orientation:  Alert and oriented to person, place and time. Questionable dysphasia, versus difficulty to find the word or dysarthria. Fund of knowledge is reduced. Recent memory impaired and remote memory impaired.  Attention and concentration are reduced.  Unable to name objects or repeat phrases. Delayed recall  0/5. Fluency 0 Cranial nerves: There is good facial symmetry. Extraocular muscles are intact and visual fields are full to confrontational testing. Speech is not fluent but clear. Soft palate rises symmetrically and there is no  tongue deviation. Hearing is mildly reduced to conversational tone. Tone: Tone is good throughout. Sensation: Sensation is intact to light touch and pinprick throughout. Vibration is intact at the bilateral big toe.There is no extinction with double simultaneous stimulation. There is no sensory dermatomal level identified. Coordination: The patient has no difficulty with RAM's or FNF bilaterally. Normal finger to nose  Motor: Strength is 5/5 in the bilateral upper and lower extremities. There is no pronator drift. There are no fasciculations noted. No tremors. DTR's: Deep tendon reflexes are 2/4 at the bilateral biceps, triceps, brachioradialis, patella and achilles.  Plantar responses are downgoing bilaterally. Gait and Station: The patient is able to ambulate with some difficulty, gait is broader based, no ataxia is noted. She walks in tandem fashion, able to stand in Romberg position. No shuffling. No en block turn seen.      Thank you for allowing [place] the opportunity to participate in the care of this nice patient. Please do not hesitate to contact us for any questions or concerns.   Total time spent on today's visit was 62 minutes dedicated to this patient today, preparing to see patient, examining the patient, ordering tests and/or medications and counseling the patient, documenting clinical information in the EHR or other health record, independently  interpreting results and communicating results to the patient/family, discussing treatment and goals, answering patient's questions and coordinating care.  Cc:  Midge Minium, MD  Sharene Butters 02/27/2022 9:22 AM

## 2022-02-27 NOTE — Patient Instructions (Addendum)
It was a pleasure to see you today at our office.   Recommendations:  CT of the brain, the radiology office will call you to arrange you appointment Referral to Psychiatry for the management of mood .In the interim, continue mood management with Primary doctor Referral to  home PT/OT for strength and balance Start Memantine '10mg'$  tablets. Take 1 tablet at bedtime for 2 weeks, then 1 tablet twice daily.     Follow up in Fri Sept 8 at 11:30  Whom to call:  Memory  decline, memory medications: Call our office [phone]   For psychiatric meds, mood meds: Please have your primary care physician or psychiatrist manage these medications.   Counseling regarding caregiver distress, including caregiver depression, anxiety and issues regarding community resources, adult day care programs, adult living facilities, or memory care questions:   Feel free to contact King, Social Worker at [phone]   For assessment of decision of mental capacity and competency:  Call Dr. Anthoney Harada, geriatric psychiatrist at [phone]  For guidance in geriatric dementia issues please call [facility] [phone]  For guidance regarding [facility] and if placement were needed at the facility, contact Arnell Asal, Social Worker tel: [phone]  If you have any severe symptoms of a stroke, or other severe issues such as confusion,severe chills or fever, etc call 911 or go to the ER as you may need to be evaluated further   Feel free to visit Facebook page " [email or web address]" for tips of how to care for people with memory problems.        RECOMMENDATIONS FOR ALL PATIENTS WITH MEMORY PROBLEMS: 1. Continue to exercise (Recommend 30 minutes of walking everyday, or 3 hours every week) 2. Increase social interactions - continue going to [place] and enjoy social gatherings with friends and family 3. Eat healthy, avoid fried foods and eat more fruits and vegetables 4.  Maintain adequate blood pressure, blood sugar, and blood cholesterol level. Reducing the risk of stroke and cardiovascular disease also helps promoting better memory. 5. Avoid stressful situations. Live a simple life and avoid aggravations. Organize your time and prepare for the next day in anticipation. 6. Sleep well, avoid any interruptions of sleep and avoid any distractions in the bedroom that may interfere with adequate sleep quality 7. Avoid sugar, avoid sweets as there is a strong link between excessive sugar intake, diabetes, and cognitive impairment We discussed the Mediterranean diet, which has been shown to help patients reduce the risk of progressive memory disorders and reduces cardiovascular risk. This includes eating fish, eat fruits and green leafy vegetables, nuts like almonds and hazelnuts, walnuts, and also use olive oil. Avoid fast foods and fried foods as much as possible. Avoid sweets and sugar as sugar use has been linked to worsening of memory function.  There is always a concern of gradual progression of memory problems. If this is the case, then we may need to adjust level of care according to patient needs. Support, both to the patient and caregiver, should then be put into place.    You have been referred for a neuropsychological evaluation (i.e., evaluation of memory and thinking abilities). Please bring someone with you to this appointment if possible, as it is helpful for the doctor to hear from both you and another adult who knows you well. Please bring eyeglasses and hearing aids if you wear them.    The evaluation will take approximately 3 hours and has two parts:  The first part is a clinical interview with the neuropsychologist (Dr. Melvyn Novas or Dr. Nicole Kindred). During the interview, the neuropsychologist will speak with you and the individual you brought to the appointment.    The second part of the evaluation is testing with the doctor's technician Hinton Dyer or Maudie Mercury). During  the testing, the technician will ask you to remember different types of material, solve problems, and answer some questionnaires. Your family member will not be present for this portion of the evaluation.   Please note: We must reserve several hours of the neuropsychologist's time and the psychometrician's time for your evaluation appointment. As such, there is a No-Show fee of $100. If you are unable to attend any of your appointments, please contact our office as soon as possible to reschedule.    FALL PRECAUTIONS: Be cautious when walking. Scan the area for obstacles that may increase the risk of trips and falls. When getting up in the mornings, sit up at the edge of the bed for a few minutes before getting out of bed. Consider elevating the bed at the head end to avoid drop of blood pressure when getting up. Walk always in a well-lit room (use night lights in the walls). Avoid area rugs or power cords from appliances in the middle of the walkways. Use a walker or a cane if necessary and consider physical therapy for balance exercise. Get your eyesight checked regularly.  FINANCIAL OVERSIGHT: Supervision, especially oversight when making financial decisions or transactions is also recommended.  HOME SAFETY: Consider the safety of the kitchen when operating appliances like stoves, microwave oven, and blender. Consider having supervision and share cooking responsibilities until no longer able to participate in those. Accidents with firearms and other hazards in the house should be identified and addressed as well.   ABILITY TO BE LEFT ALONE: If patient is unable to contact 911 operator, consider using LifeLine, or when the need is there, arrange for someone to stay with patients. Smoking is a fire hazard, consider supervision or cessation. Risk of wandering should be assessed by caregiver and if detected at any point, supervision and safe proof recommendations should be instituted.  MEDICATION  SUPERVISION: Inability to self-administer medication needs to be constantly addressed. Implement a mechanism to ensure safe administration of the medications.   DRIVING: Regarding driving, in patients with progressive memory problems, driving will be impaired. We advise to have someone else do the driving if trouble finding directions or if minor accidents are reported. Independent driving assessment is available to determine safety of driving.   If you are interested in the driving assessment, you can contact the following:  [employer] in [place]  [place] [phone] or [phone]    [place] refers to food and lifestyle choices that are based on the traditions of countries located on the [employer]. This way of eating has been shown to help prevent certain conditions and improve outcomes for people who have chronic diseases, like kidney disease and heart disease. What are tips for following this plan? Lifestyle  Cook and eat meals together with your family, when possible. Drink enough fluid to keep your urine clear or pale yellow. Be physically active every day. This includes: Aerobic exercise like running or swimming. Leisure activities like gardening, walking, or housework. Get 7-8 hours of sleep each night. If recommended by your health care provider, drink red wine in moderation. This means  1 glass a day for nonpregnant women and 2 glasses a day for men. A glass of wine equals 5 oz (150 mL). Reading food labels  Check the serving size of packaged foods. For foods such as rice and pasta, the serving size refers to the amount of cooked product, not dry. Check the total fat in packaged foods. Avoid foods that have saturated fat or trans fats. Check the ingredients list for added sugars, such as corn syrup. Shopping  At  the grocery store, buy most of your food from the areas near the walls of the store. This includes: Fresh fruits and vegetables (produce). Grains, beans, nuts, and seeds. Some of these may be available in unpackaged forms or large amounts (in bulk). Fresh seafood. Poultry and eggs. Low-fat dairy products. Buy whole ingredients instead of prepackaged foods. Buy fresh fruits and vegetables in-season from local farmers markets. Buy frozen fruits and vegetables in resealable bags. If you do not have access to quality fresh seafood, buy precooked frozen shrimp or canned fish, such as tuna, salmon, or sardines. Buy small amounts of raw or cooked vegetables, salads, or olives from the deli or salad bar at your store. Stock your pantry so you always have certain foods on hand, such as olive oil, canned tuna, canned tomatoes, rice, pasta, and beans. Cooking  Cook foods with extra-virgin olive oil instead of using butter or other vegetable oils. Have meat as a side dish, and have vegetables or grains as your main dish. This means having meat in small portions or adding small amounts of meat to foods like pasta or stew. Use beans or vegetables instead of meat in common dishes like chili or lasagna. Experiment with different cooking methods. Try roasting or broiling vegetables instead of steaming or sauteing them. Add frozen vegetables to soups, stews, pasta, or rice. Add nuts or seeds for added healthy fat at each meal. You can add these to yogurt, salads, or vegetable dishes. Marinate fish or vegetables using olive oil, lemon juice, garlic, and fresh herbs. Meal planning  Plan to eat 1 vegetarian meal one day each week. Try to work up to 2 vegetarian meals, if possible. Eat seafood 2 or more times a week. Have healthy snacks readily available, such as: Vegetable sticks with hummus. Greek yogurt. Fruit and nut trail mix. Eat balanced meals throughout the week. This includes: Fruit: 2-3 servings a  day Vegetables: 4-5 servings a day Low-fat dairy: 2 servings a day Fish, poultry, or lean meat: 1 serving a day Beans and legumes: 2 or more servings a week Nuts and seeds: 1-2 servings a day Whole grains: 6-8 servings a day Extra-virgin olive oil: 3-4 servings a day Limit red meat and sweets to only a few servings a month What are my food choices? Mediterranean diet Recommended Grains: Whole-grain pasta. Brown rice. Bulgar wheat. Polenta. Couscous. Whole-wheat bread. [place]. Vegetables: Artichokes. Beets. Broccoli. Cabbage. Carrots. Eggplant. Green beans. Chard. Kale. Spinach. Onions. Leeks. Peas. Squash. Tomatoes. Peppers. Radishes. Fruits: Apples. Apricots. Avocado. Berries. Bananas. Cherries. Dates. Figs. Grapes. Lemons. Melon. Oranges. Peaches. Plums. Pomegranate. Meats and other protein foods: Beans. Almonds. Sunflower seeds. Pine nuts. Peanuts. [place]. Salmon. Scallops. Shrimp. [place]. Tilapia. Clams. Oysters. Eggs. Dairy: Low-fat milk. Cheese. Greek yogurt. Beverages: Water. Red wine. Herbal tea. Fats and oils: Extra virgin olive oil. Avocado oil. Grape seed oil. Sweets and desserts: [place] yogurt with honey. Baked apples. Poached pears. Trail mix. Seasoning and other foods: Basil. Cilantro. Coriander. Cumin. Mint. Parsley. Sage. Rosemary. Tarragon.  Garlic. Oregano. Thyme. Pepper. Balsalmic vinegar. Tahini. Hummus. Tomato sauce. Olives. Mushrooms. Limit these Grains: Prepackaged pasta or rice dishes. Prepackaged cereal with added sugar. Vegetables: Deep fried potatoes (french fries). Fruits: Fruit canned in syrup. Meats and other protein foods: Beef. Pork. Lamb. Poultry with skin. Hot dogs. Berniece Salines. Dairy: Ice cream. Sour cream. Whole milk. Beverages: Juice. Sugar-sweetened soft drinks. Beer. Liquor and spirits. Fats and oils: Butter. Canola oil. Vegetable oil. Beef fat (tallow). Lard. Sweets and desserts: Cookies. Cakes. Pies. Candy. Seasoning and other foods: Mayonnaise.  Premade sauces and marinades. The items listed may not be a complete list. Talk with your dietitian about what dietary choices are right for you. Summary The Mediterranean diet includes both food and lifestyle choices. Eat a variety of fresh fruits and vegetables, beans, nuts, seeds, and whole grains. Limit the amount of red meat and sweets that you eat. Talk with your health care provider about whether it is safe for you to drink red wine in moderation. This means 1 glass a day for nonpregnant women and 2 glasses a day for men. A glass of wine equals 5 oz (150 mL). This information is not intended to replace advice given to you by your health care provider. Make sure you discuss any questions you have with your health care provider. Document Released: 05/08/2016 Document Revised: 06/10/2016 Document Reviewed: 05/08/2016 Elsevier Interactive Patient Education  2017 [employer].

## 2022-02-27 NOTE — Telephone Encounter (Signed)
Neurology is declining prescribing demansia medication please advise I am not sure how to proceed, should request neruo notes?

## 2022-03-20 ENCOUNTER — Ambulatory Visit (INDEPENDENT_AMBULATORY_CARE_PROVIDER_SITE_OTHER): Payer: Medicare HMO

## 2022-03-20 DIAGNOSIS — Z0000 Encounter for general adult medical examination without abnormal findings: Secondary | ICD-10-CM | POA: Diagnosis not present

## 2022-03-20 NOTE — Progress Notes (Signed)
Subjective:   Breanna Yang is a 88 y.o. female who presents for Medicare Annual (Subsequent) preventive examination.   I connected with Metro Kung  today by telephone and verified that I am speaking with the correct person using two identifiers. Location patient: home Location provider: work Persons participating in the virtual visit: patient, provider.   I discussed the limitations, risks, security and privacy concerns of performing an evaluation and management service by telephone and the availability of in person appointments. I also discussed with the patient that there may be a patient responsible charge related to this service. The patient expressed understanding and verbally consented to this telephonic visit.    Interactive audio and video telecommunications were attempted between this provider and patient, however failed, due to patient having technical difficulties OR patient did not have access to video capability.  We continued and completed visit with audio only.    Review of Systems     Cardiac Risk Factors include: advanced age (>104mn, >>90women)     Objective:    Today's Vitals   There is no height or weight on file to calculate BMI.     03/20/2022    9:26 AM 02/27/2022    7:55 AM 01/20/2022    3:20 PM 01/21/2021   12:58 PM 01/15/2017    9:21 AM 11/17/2016   10:35 AM 11/05/2016   10:25 AM  Advanced Directives  Does Patient Have a Medical Advance Directive? Yes Yes Yes Yes No  No  Type of [place] will  [place] will     Does patient want to make changes to medical advance directive?   No - Guardian declined      Copy of [place] Chart? No - copy requested  No - copy requested No - copy requested     Would patient like information on creating a medical advance directive?     Yes (MAU/Ambulatory/Procedural Areas - Information  given) No - Patient declined No - Patient declined    Current Medications (verified) Outpatient Encounter Medications as of 03/20/2022  Medication Sig   Cholecalciferol (VITAMIN D) 2000 units tablet Take 2,000 Units by mouth daily.   ENSURE (ENSURE) Take 237 mLs by mouth at bedtime.   ketoconazole (NIZORAL) 2 % shampoo Shampoo with  a small amount as directed three times a week   memantine (NAMENDA) 10 MG tablet Take 1 tablet (10 mg at night) for 2 weeks, then increase to 1 tablet (10 mg) twice a day   DERMA-SMOOTHE/FS SCALP 0.01 % OIL Apply 1 a small amount to affected area as directed  Apply to scalp leave in qhs, wash out in the am   Multiple Vitamin (MULTIVITAMIN) capsule Take 1 capsule by mouth daily. (Patient not taking: Reported on 03/20/2022)   PARoxetine (PAXIL) 20 MG tablet TAKE 1 TABLET BY MOUTH EVERY DAY (Patient not taking: Reported on 03/20/2022)   Vitamin D, Ergocalciferol, (DRISDOL) 1.25 MG (50000 UNIT) CAPS capsule Take 1 capsule (50,000 Units total) by mouth every 7 (seven) days.   Vitamin D, Ergocalciferol, (DRISDOL) 1.25 MG (50000 UNIT) CAPS capsule Take 1 capsule (50,000 Units total) by mouth every 7 (seven) days.   No facility-administered encounter medications on file as of 03/20/2022.    Allergies (verified) Patient has no known allergies.   History: Past Medical History:  Diagnosis Date   Depression    GERD (gastroesophageal reflux disease)  Hyperlipidemia    Hypertension    Osteoarthritis    Osteoporosis    Past Surgical History:  Procedure Laterality Date   BRAIN SURGERY  1985   anneurysm   Family History  Problem Relation Age of Onset   Cancer Sister    Social History   Socioeconomic History   Marital status: Widowed    Spouse name: Not on file   Number of children: 2   Years of education: Some College   Highest education level: Some college, no degree  Occupational History   Not on file  Tobacco Use   Smoking status: Never    Passive  exposure: Never   Smokeless tobacco: Never  Vaping Use   Vaping Use: Never used  Substance and Sexual Activity   Alcohol use: No   Drug use: No   Sexual activity: Not Currently  Other Topics Concern   Not on file  Social History Narrative   Patient lives with daughter in a one story home.  Has 2 adopted children.  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  Education: some college.    Social Determinants of Health   Financial Resource Strain: Low Risk  (03/20/2022)   Overall Financial Resource Strain (CARDIA)    Difficulty of Paying Living Expenses: Not hard at all  Food Insecurity: No Food Insecurity (01/20/2022)   Hunger Vital Sign    Worried About Running Out of Food in the Last Year: Never true    Ran Out of Food in the Last Year: Never true  Transportation Needs: No Transportation Needs (03/20/2022)   PRAPARE - Hydrologist (Medical): No    Lack of Transportation (Non-Medical): No  Physical Activity: Insufficiently Active (03/20/2022)   Exercise Vital Sign    Days of Exercise per Week: 3 days    Minutes of Exercise per Session: 30 min  Stress: Stress Concern Present (03/20/2022)   [place]    Feeling of Stress : To some extent  Social Connections: Moderately Isolated (03/20/2022)   Social Connection and Isolation Panel [NHANES]    Frequency of Communication with Friends and Family: Three times a week    Frequency of Social Gatherings with Friends and Family: Three times a week    Attends Religious Services: More than 4 times per year    Active Member of Clubs or Organizations: No    Attends Archivist Meetings: Never    Marital Status: Widowed    Tobacco Counseling Counseling given: Not Answered   Clinical Intake:  Pre-visit preparation completed: Yes  Pain : No/denies pain     Nutritional Risks: None Diabetes: No  How often do you need to have someone help you  when you read instructions, pamphlets, or other written materials from your doctor or pharmacy?: 1 - Never What is the last grade level you completed in school?: High School  Diabetic?no   Interpreter Needed?: No  Information entered by :: L.Lenaya Pietsch,LPN   Activities of Daily Living    03/20/2022    9:31 AM 01/20/2022    3:17 PM  In your present state of health, do you have any difficulty performing the following activities:  Hearing? 0 1  Comment  Hard of Hearing  Vision? 0 0  Difficulty concentrating or making decisions? 0 1  Comment  Vacular Dementia without Behavioral Disturbance  Walking or climbing stairs? 0 1  Comment  Gait Instability  Dressing or bathing? 0 0  Doing errands, shopping? 0 1  Comment  Daughter and Sister [place] and eating ? N Y  Comment  Daughter and Sister Assist  Using the Toilet? N N  In the past six months, have you accidently leaked urine? N N  Do you have problems with loss of bowel control? N N  Managing your Medications? N Y  Comment  Daughter and Sister Assist  Managing your Finances? N Y  Comment  Daughter and Sister Assist  Housekeeping or managing your Housekeeping? N Y  Comment  Daughter and Sister Assist    Patient Care Team: Midge Minium, MD as PCP - General (Family Medicine) Hurley Cisco, MD as Consulting Physician (Rheumatology) Druscilla Brownie, MD as Consulting Physician (Dermatology) Rutherford Guys, MD as Consulting Physician (Ophthalmology)  Indicate any recent Medical Services you may have received from other than [facility] providers in the past year (date may be approximate).     Assessment:   This is a routine wellness examination for [place].  Hearing/Vision screen Vision Screening - Comments:: Annual eye exams wears glasses   Dietary issues and exercise activities discussed: Current Exercise Habits: Home exercise routine, Type of exercise: walking, Time (Minutes): 30, Frequency (Times/Week): 3,  Weekly Exercise (Minutes/Week): 90, Intensity: Mild, Exercise limited by: neurologic condition(s)   Goals Addressed   None    Depression Screen    03/20/2022    9:27 AM 03/20/2022    9:25 AM 02/06/2022   10:43 AM 01/20/2022    3:10 PM 01/01/2022    1:04 PM 05/31/2021    2:11 PM 01/21/2021    1:01 PM  PHQ 2/9 Scores  PHQ - 2 Score 0 0 0 0 0 5 0  PHQ- 9 Score   0 '8 8 14     '$ Fall Risk    03/20/2022    9:27 AM 02/27/2022    7:55 AM 02/06/2022   10:45 AM 01/20/2022    3:16 PM 01/01/2022    1:04 PM  [place] in the past year? 0 1 0 1 1  Number falls in past yr: 0 1 0 1 1  Injury with Fall? 0 0 0 0 0  Risk for fall due to : Mental status change  No Fall Risks History of fall(s);Mental status change History of fall(s)  Risk for fall due to: Comment walker      Follow up Falls evaluation completed;Education provided  Falls evaluation completed Falls evaluation completed;Education provided;Falls prevention discussed;Follow up appointment Falls evaluation completed    FALL RISK PREVENTION PERTAINING TO THE HOME:  Any stairs in or around the home? Yes  If so, are there any without handrails? No  Home free of loose throw rugs in walkways, pet beds, electrical cords, etc? Yes  Adequate lighting in your home to reduce risk of falls? Yes   ASSISTIVE DEVICES UTILIZED TO PREVENT FALLS:  Life alert? No  Use of a cane, walker or w/c? No  Grab bars in the bathroom? No  Shower chair or bench in shower? No  Elevated toilet seat or a handicapped toilet? No     Cognitive status assessed by telephone conversation Patient has current diagnosis of cognitive impairment. Patient is followed by neurology for ongoing assessment. Dr Charmayne Sheer Patient is unable to complete screening 6CIT or MMSE.  Dionne Milo sister helped complete [place]     04/06/2017   10:00 AM 01/15/2017    9:27 AM  MMSE - Mini Mental State Exam  Orientation  to time 5 5  Orientation to Place 5 5  Registration 3 3  Attention/  Calculation 4 4  Recall 1 2  Language- name 2 objects 2 2  Language- repeat 1 1  Language- follow 3 step command 3 3  Language- read & follow direction 1 1  Write a sentence 1 1  Copy design 1 0  Total score 27 27      02/27/2022    9:00 AM 12/03/2016    2:00 PM  Montreal Cognitive Assessment   Visuospatial/ Executive (0/5) 0 5  Naming (0/3) 0 2  Attention: Read list of digits (0/2) 1 2  Attention: Read list of letters (0/1) 0 1  Attention: Serial 7 subtraction starting at 100 (0/3) 0 2  Language: Repeat phrase (0/2) 0 2  Language : Fluency (0/1) 0 0  Abstraction (0/2) 0 1  Delayed Recall (0/5) 0 4  Orientation (0/6) 0 6  Total 1 25  Adjusted Score (based on education) 1       01/21/2021    1:10 PM  6CIT Screen  What Year? 4 points  What month? 0 points  What time? 0 points  Count back from 20 0 points  Months in reverse 4 points  Repeat phrase 10 points  Total Score 18 points    Immunizations Immunization History  Administered Date(s) Administered   Fluad Quad(high Dose 65+) 06/02/2019, 06/14/2020, 06/20/2021   Influenza, High Dose Seasonal PF 06/15/2018   Influenza,inj,Quad PF,6+ Mos 10/10/2016, 06/16/2017   PFIZER(Purple Top)SARS-COV-2 Vaccination 10/18/2019, 11/07/2019, 10/02/2020   Pneumococcal Conjugate-13 09/07/2013   Pneumococcal Polysaccharide-23 07/31/2009, 07/04/2016   Tdap 06/29/2006   Zoster, Live 07/25/2008    TDAP status: Due, Education has been provided regarding the importance of this vaccine. Advised may receive this vaccine at local pharmacy or Health Dept. Aware to provide a copy of the vaccination record if obtained from local pharmacy or Health Dept. Verbalized acceptance and understanding.  Flu Vaccine status: Up to date  Pneumococcal vaccine status: Up to date  Covid-19 vaccine status: Completed vaccines  Qualifies for Shingles Vaccine? Yes   Zostavax completed No   Shingrix Completed?: No.    Education has been provided regarding the  importance of this vaccine. Patient has been advised to call insurance company to determine out of pocket expense if they have not yet received this vaccine. Advised may also receive vaccine at local pharmacy or Health Dept. Verbalized acceptance and understanding.  Screening Tests Health Maintenance  Topic Date Due   INFLUENZA VACCINE  04/29/2022   Pneumonia Vaccine 72+ Years old  Completed   DEXA SCAN  Completed   HPV VACCINES  Aged Out   COLONOSCOPY (Pts 45-41yr Insurance coverage will need to be confirmed)  Discontinued   TETANUS/TDAP  Discontinued   COVID-19 Vaccine  Discontinued   Zoster Vaccines- Shingrix  Discontinued    Health Maintenance  There are no preventive care reminders to display for this patient.  Colorectal cancer screening: No longer required.   Mammogram status: No longer required due to age .  Bone Density status: Ordered declined by family . Pt provided with contact info and advised to call to schedule appt.  Lung Cancer Screening: (Low Dose CT Chest recommended if Age 86-80years, 30 pack-year currently smoking OR have quit w/in 15years.) does not qualify.   Lung Cancer Screening Referral: n/a  Additional Screening:  Hepatitis C Screening: does not qualify;   Vision Screening: Recommended annual ophthalmology exams for early detection of  glaucoma and other disorders of the eye. Is the patient up to date with their annual eye exam?  Yes  Who is the provider or what is the name of the office in which the patient attends annual eye exams? Dr Corrin Parker  If pt is not established with a provider, would they like to be referred to a provider to establish care? No .   Dental Screening: Recommended annual dental exams for proper oral hygiene  Community Resource Referral / Chronic Care Management: CRR required this visit?  No   CCM required this visit?  No      Plan:     I have personally reviewed and noted the following in the patient's chart:    Medical and social history Use of alcohol, tobacco or illicit drugs  Current medications and supplements including opioid prescriptions.  Functional ability and status Nutritional status Physical activity Advanced directives List of other physicians Hospitalizations, surgeries, and ER visits in previous 12 months Vitals Screenings to include cognitive, depression, and falls Referrals and appointments  In addition, I have reviewed and discussed with patient certain preventive protocols, quality metrics, and best practice recommendations. A written personalized care plan for preventive services as well as general preventive health recommendations were provided to patient.     Randel Pigg, LPN   01/21/9562   Nurse Notes: none

## 2022-03-20 NOTE — Patient Instructions (Signed)
Breanna Yang , Thank you for taking time to come for your Medicare Wellness Visit. I appreciate your ongoing commitment to your health goals. Please review the following plan we discussed and let me know if I can assist you in the future.   Screening recommendations/referrals: Colonoscopy: 05/23/2021 Mammogram: 04/19/2021 Bone Density: declined  Recommended yearly ophthalmology/optometry visit for glaucoma screening and checkup Recommended yearly dental visit for hygiene and checkup  Vaccinations: Influenza vaccine: completed  Pneumococcal vaccine: completed  Tdap vaccine: 01/08/2021 Shingles vaccine: declined     Advanced directives: yes   Conditions/risks identified: none   Next appointment: none    Preventive Care 68 Years and Older, Female Preventive care refers to lifestyle choices and visits with your health care provider that can promote health and wellness. What does preventive care include? A yearly physical exam. This is also called an annual well check. Dental exams once or twice a year. Routine eye exams. Ask your health care provider how often you should have your eyes checked. Personal lifestyle choices, including: Daily care of your teeth and gums. Regular physical activity. Eating a healthy diet. Avoiding tobacco and drug use. Limiting alcohol use. Practicing safe sex. Taking low-dose aspirin every day. Taking vitamin and mineral supplements as recommended by your health care provider. What happens during an annual well check? The services and screenings done by your health care provider during your annual well check will depend on your age, overall health, lifestyle risk factors, and family history of disease. Counseling  Your health care provider may ask you questions about your: Alcohol use. Tobacco use. Drug use. Emotional well-being. Home and relationship well-being. Sexual activity. Eating habits. History of falls. Memory and ability to  understand (cognition). Work and work Statistician. Reproductive health. Screening  You may have the following tests or measurements: Height, weight, and BMI. Blood pressure. Lipid and cholesterol levels. These may be checked every 5 years, or more frequently if you are over 79 years old. Skin check. Lung cancer screening. You may have this screening every year starting at age 78 if you have a 30-pack-year history of smoking and currently smoke or have quit within the past 15 years. Fecal occult blood test (FOBT) of the stool. You may have this test every year starting at age 78. Flexible sigmoidoscopy or colonoscopy. You may have a sigmoidoscopy every 5 years or a colonoscopy every 10 years starting at age 47. Hepatitis C blood test. Hepatitis B blood test. Sexually transmitted disease (STD) testing. Diabetes screening. This is done by checking your blood sugar (glucose) after you have not eaten for a while (fasting). You may have this done every 1-3 years. Bone density scan. This is done to screen for osteoporosis. You may have this done starting at age 70. Mammogram. This may be done every 1-2 years. Talk to your health care provider about how often you should have regular mammograms. Talk with your health care provider about your test results, treatment options, and if necessary, the need for more tests. Vaccines  Your health care provider may recommend certain vaccines, such as: Influenza vaccine. This is recommended every year. Tetanus, diphtheria, and acellular pertussis (Tdap, Td) vaccine. You may need a Td booster every 10 years. Zoster vaccine. You may need this after age 35. Pneumococcal 13-valent conjugate (PCV13) vaccine. One dose is recommended after age 63. Pneumococcal polysaccharide (PPSV23) vaccine. One dose is recommended after age 57. Talk to your health care provider about which screenings and vaccines you need and how often  you need them. This information is not  intended to replace advice given to you by your health care provider. Make sure you discuss any questions you have with your health care provider. Document Released: 10/12/2015 Document Revised: 06/04/2016 Document Reviewed: 07/17/2015 Elsevier Interactive Patient Education  2017 [place] Prevention in the Home Falls can cause injuries. They can happen to people of all ages. There are many things you can do to make your home safe and to help prevent falls. What can I do on the outside of my home? Regularly fix the edges of walkways and driveways and fix any cracks. Remove anything that might make you trip as you walk through a door, such as a raised step or threshold. Trim any bushes or trees on the path to your home. Use bright outdoor lighting. Clear any walking paths of anything that might make someone trip, such as rocks or tools. Regularly check to see if handrails are loose or broken. Make sure that both sides of any steps have handrails. Any raised decks and porches should have guardrails on the edges. Have any leaves, snow, or ice cleared regularly. Use sand or salt on walking paths during winter. Clean up any spills in your garage right away. This includes oil or grease spills. What can I do in the bathroom? Use night lights. Install grab bars by the toilet and in the tub and shower. Do not use towel bars as grab bars. Use non-skid mats or decals in the tub or shower. If you need to sit down in the shower, use a plastic, non-slip stool. Keep the floor dry. Clean up any water that spills on the floor as soon as it happens. Remove soap buildup in the tub or shower regularly. Attach bath mats securely with double-sided non-slip rug tape. Do not have throw rugs and other things on the floor that can make you trip. What can I do in the bedroom? Use night lights. Make sure that you have a light by your bed that is easy to reach. Do not use any sheets or blankets that are  too big for your bed. They should not hang down onto the floor. Have a firm chair that has side arms. You can use this for support while you get dressed. Do not have throw rugs and other things on the floor that can make you trip. What can I do in the kitchen? Clean up any spills right away. Avoid walking on wet floors. Keep items that you use a lot in easy-to-reach places. If you need to reach something above you, use a strong step stool that has a grab bar. Keep electrical cords out of the way. Do not use floor polish or wax that makes floors slippery. If you must use wax, use non-skid floor wax. Do not have throw rugs and other things on the floor that can make you trip. What can I do with my stairs? Do not leave any items on the stairs. Make sure that there are handrails on both sides of the stairs and use them. Fix handrails that are broken or loose. Make sure that handrails are as long as the stairways. Check any carpeting to make sure that it is firmly attached to the stairs. Fix any carpet that is loose or worn. Avoid having throw rugs at the top or bottom of the stairs. If you do have throw rugs, attach them to the floor with carpet tape. Make sure that you have a light  switch at the top of the stairs and the bottom of the stairs. If you do not have them, ask someone to add them for you. What else can I do to help prevent falls? Wear shoes that: Do not have high heels. Have rubber bottoms. Are comfortable and fit you well. Are closed at the toe. Do not wear sandals. If you use a stepladder: Make sure that it is fully opened. Do not climb a closed stepladder. Make sure that both sides of the stepladder are locked into place. Ask someone to hold it for you, if possible. Clearly mark and make sure that you can see: Any grab bars or handrails. First and last steps. Where the edge of each step is. Use tools that help you move around (mobility aids) if they are needed. These  include: Canes. Walkers. Scooters. Crutches. Turn on the lights when you go into a dark area. Replace any light bulbs as soon as they burn out. Set up your furniture so you have a clear path. Avoid moving your furniture around. If any of your floors are uneven, fix them. If there are any pets around you, be aware of where they are. Review your medicines with your doctor. Some medicines can make you feel dizzy. This can increase your chance of falling. Ask your doctor what other things that you can do to help prevent falls. This information is not intended to replace advice given to you by your health care provider. Make sure you discuss any questions you have with your health care provider. Document Released: 07/12/2009 Document Revised: 02/21/2016 Document Reviewed: 10/20/2014 Elsevier Interactive Patient Education  2017 [employer].

## 2022-03-21 ENCOUNTER — Other Ambulatory Visit: Payer: Self-pay | Admitting: Physician Assistant

## 2022-03-31 ENCOUNTER — Ambulatory Visit
Admission: RE | Admit: 2022-03-31 | Discharge: 2022-03-31 | Disposition: A | Discharge status: Home / Self Care | Payer: Medicare HMO | Source: Ambulatory Visit | Attending: Physician Assistant | Admitting: Physician Assistant

## 2022-03-31 DIAGNOSIS — R413 Other amnesia: Secondary | ICD-10-CM | POA: Diagnosis not present

## 2022-04-21 DIAGNOSIS — H524 Presbyopia: Secondary | ICD-10-CM | POA: Diagnosis not present

## 2022-04-21 DIAGNOSIS — Z961 Presence of intraocular lens: Secondary | ICD-10-CM | POA: Diagnosis not present

## 2022-04-21 DIAGNOSIS — H5213 Myopia, bilateral: Secondary | ICD-10-CM | POA: Diagnosis not present

## 2022-04-21 DIAGNOSIS — H52203 Unspecified astigmatism, bilateral: Secondary | ICD-10-CM | POA: Diagnosis not present

## 2022-05-18 ENCOUNTER — Encounter: Payer: Self-pay | Admitting: Physician Assistant

## 2022-05-26 NOTE — Telephone Encounter (Signed)
sorry to hear this. We can help with paperwork regarding her diagnosis, but we are unable to do anything that involves legal paperwork. Competency is done by geriatric psychiatrists.Please contact Dr. Anthoney Harada for assessment of decision of mental capacity and competency [phone] You can also call [place] [phone] for guidance on geriatric dementia issues    However I can connect you with our social worker,   Social Worker, to discuss  community resources, etc . She may be able to guide you as well on how to proceed. [phone].  Thanks

## 2022-05-28 ENCOUNTER — Encounter: Payer: Self-pay | Admitting: Physician Assistant

## 2022-05-28 ENCOUNTER — Other Ambulatory Visit: Payer: Self-pay

## 2022-05-28 DIAGNOSIS — R296 Repeated falls: Secondary | ICD-10-CM

## 2022-05-28 DIAGNOSIS — F02818 Dementia in other diseases classified elsewhere, unspecified severity, with other behavioral disturbance: Secondary | ICD-10-CM

## 2022-05-28 NOTE — Telephone Encounter (Signed)
Hi CHristy, let's order a wheelchair for frequent falls and progressive dementia.  Thanks

## 2022-06-01 ENCOUNTER — Encounter: Payer: Self-pay | Admitting: Family Medicine

## 2022-06-03 ENCOUNTER — Other Ambulatory Visit: Payer: Self-pay

## 2022-06-03 MED ORDER — VITAMIN D (ERGOCALCIFEROL) 1.25 MG (50000 UNIT) PO CAPS: 50000.0000 [IU] | ORAL_CAPSULE | ORAL | 12 refills | Status: DC

## 2022-06-06 ENCOUNTER — Ambulatory Visit: Payer: Medicare HMO | Admitting: Physician Assistant

## 2022-06-10 ENCOUNTER — Encounter: Payer: Self-pay | Admitting: Physician Assistant

## 2022-06-11 NOTE — Telephone Encounter (Signed)
Not sure on how to do this, but need you to make an appt with our social worker  asap Misty she is the person who can guide you on how to proceed because she knows those resources, thanks and sorry about her sister.  Also,  Dr. Anthoney Harada for assessment of decision of mental capacity and competency [phone] can be of help  You can also call [place] [phone] for guidance on geriatric dementia issues

## 2022-06-11 NOTE — Telephone Encounter (Signed)
How about a video visit instead when you are available with her? She can be at home more comfortably. We can set that up when is convenient to you. Let us know if that works. Thanks

## 2022-06-16 ENCOUNTER — Ambulatory Visit: Payer: Medicare HMO | Admitting: Physician Assistant

## 2022-06-30 ENCOUNTER — Encounter: Payer: Self-pay | Admitting: Physician Assistant

## 2022-07-07 ENCOUNTER — Other Ambulatory Visit: Payer: Self-pay

## 2022-07-07 ENCOUNTER — Telehealth (INDEPENDENT_AMBULATORY_CARE_PROVIDER_SITE_OTHER): Payer: Medicare HMO | Admitting: Physician Assistant

## 2022-07-07 DIAGNOSIS — G309 Alzheimer's disease, unspecified: Secondary | ICD-10-CM | POA: Diagnosis not present

## 2022-07-07 DIAGNOSIS — R296 Repeated falls: Secondary | ICD-10-CM | POA: Diagnosis not present

## 2022-07-07 DIAGNOSIS — F01518 Vascular dementia, unspecified severity, with other behavioral disturbance: Secondary | ICD-10-CM | POA: Diagnosis not present

## 2022-07-07 NOTE — Progress Notes (Signed)
Virtual Visit via Video Note The purpose of this virtual visit is to provide medical care in a patient that is unable to be seen in person due to physical or health limitations   Consent was obtained for video visit:  yes  Answered questions that patient had about telehealth interaction:  yes I discussed the limitations, risks, security and privacy concerns of performing an evaluation and management service by telemedicine. I also discussed with the patient that there may be a patient responsible charge related to this service. The patient expressed understanding and agreed to proceed.  Pt location: Home Physician Location: office Name of referring provider:  Midge Minium, MD I connected with Breanna Yang at patients initiation/request on 07/07/2022 at 12:30 PM EDT by video enabled telemedicine application and verified that I am speaking with the correct person using two identifiers. Pt MRN:  [id number] Pt DOB:  08-23-1934 Video Participants:  [name]; daughter Dawn    Assessment and Plan:   88 y.o. 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 Cognitive decline is present, with last MoCA on 02/2022 at 1/30. CT head 03/2022 personally reviewed showed suboccipital craniectomy changes  with encephalomalacia in the posterior fossa mostly in the right cerebellum, small area of encephalomalacia in the right frontal lobe. Small old lacunar infarcts in the bilateral basal ganglia, extensive patchy hypodensities likely secondary to chronic microvascular ischemic changes and post-surgical changes.  She is on memantine 10 mg twice daily.  In today's visit, the patient's memory may be worse than  prior, however, she had recently lost her sister whom she was very close to, therefore her changes could be exacerbated by situational depression.  Continue memantine 10 mg bid .  Will need to reassess in a couple of months, as the patient may be experiencing situational depression due to the death of her closest family member, that is affecting her memory.  For now, we will continue with the medicine. Follow up in 2  months virtual visit  Continue home PT/OT for strength and balance  Please contact Dr. Anthoney Harada for assessment of decision of mental capacity and competency [phone] if needed  Update:   Patient is seen today on virtual visit.  She was last seen in January 2023.  She is on memantine 10 mg twice daily, tolerating well.  Since her last visit however, her daughter reports that her memory may be worse, initially responding well to memantine until her sister, whom she was very close to, died.  She is now experiencing some situational depression, which may have affected her memory.  She is not taking Paxil, but she is having an appointment soon with her PCP, and is to discuss other agents.  She experiences some memory loss, but information "sometimes come back to her ".  Denies hallucinations or paranoia.  Appetite is fairly okay is provided by [employer] and complemented by Ensure.  She does not drink enough water she does not sleep very much, she goes to sleep by 8 PM.  Does not hygiene concerns, she feels uncomfortable getting into the tub.  Daughter has to assist her with bathing and dressing.  There is in charge of the finances.  She continues to  have fall risk, last 1 week ago hitting her face and legs, no loss of consciousness or other head injury.  She uses a wheelchair to prevent any further falls.  The patient has a sitter 2 times a week.  No recent hospitalizations.  No recent UTIs.  No GI complaints.  No recent strokelike symptoms  Initial history of Present Illness  January 2023:   How long did patient have memory difficulties?  For at least 5 years, worse over the last 6 months. She had lost to follow up for unclear reasons-sister said she was feeling "fine". She had not been on any memory medications. In fact she had been prescribed Aricept back then, but she has not taken it. "She has a hard time having the word coming out. She has forgotten the dates"-sister reports .  Patient lives with: her daughter during the day, and she spends the night with her sister but is still insistent to come to her daughter's house during the day. Sometimes she is alone and at that point she is at the risk of wandering off.  repeats oneself? Endorsed, more frequently over the last 6 months."I want to have breakfast" was repeated several times during this visit  Disoriented when walking into a room? Endorsed: "where are we at?" She asks her family frequently   Leaving objects in unusual places?  Patient and sister deny  Ambulates  with difficulty? SHe appears to be deconditioned, her daughter has bought her 3 different walkers and she refuses to use them. She is at risk for falls Recent falls?  Endorsed. "She has fallen several times in the yard when trying to do things she really shouldn't be doing". Falls occur mostly when she bends down to pick flowers or something from the ground.. She never had PT/OT   Any head injuries?  "My head hurts in the top where I have a hole, and also in the back of the head sometimes" no vertigo, nausea, vomiting or dizziness, no syncope. History of seizures?   Patient denies   Wandering behavior? Daughter reports that recently she has began to wander around the neighborhood and has even crossed [place] to walk to the store [phone]) "One time she was found in the woods .   Patient drives?   Patient no longer drives   Any mood changes such irritability agitation?  Daughter reports that she is combative towards her and her  sister when she does not get her way. She "tells [place] foul words a lot" Any history of depression?:  Patient denies   Hallucinations? Sister Denies Paranoia?  Patient denies   Patient reports that he sleeps well without vivid dreams, REM behavior or sleepwalking   History of sleep apnea?  Patient denies   Any hygiene concerns?  Patient denies. Her hygiene is impeccable   Independent of bathing and dressing?  Endorsed, although sometimes she sleeps with her street clothes.   Does the patient needs help with medications?  Daughter is in charge Who is in charge of the finances?  Daughter is in charge    Any changes in appetite?  She don't eat like she did, she forgets to eat sometimes    Patient have trouble swallowing? Patient denies   Does the patient cook?  Patient denies   Any kitchen accidents such as leaving the stove on? Patient denies   Any headaches? Endorsed. "Since the aneurysm, takes tylenol sometimes and goes away"  Double vision? Patient denies  Any focal numbness or tingling?  Patient denies   Chronic back pain Patient denies   Unilateral weakness?  Patient denies   Any tremors?  Patient denies   Any history of anosmia?  Patient denies   Any incontinence of urine?  Patient denies   Any bowel dysfunction?   Patient denies   History of heavy alcohol intake?  Patient denies   History of heavy tobacco use?  Patient denies   Family history of dementia?  3 brothers with dementia  9 brothers and sisters. Widowed. She retired after having her aneurysm in 1985. She used to make wedding and prom dresses     CT head 11/05/16 Old posterior fossa aneurysm clipping on the right. Occipital craniectomy. 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 Right frontal encephalomalacia due to previous ventriculostomy. Atrophy and chronic small-vessel ischemic changes of the white matter, progressive since 2008. The ventricles are larger, but this is roughly in proportion to the progressive  white matter disease.     Visit 10/07/2017, Dr. Candie Mile had the pleasure of seeing Breanna Yang in follow-up in the neurology clinic on 10/07/2017.  The patient was last seen 6 months ago for syncope, balance problems, and memory loss. She is accompanied by her daughter who helps supplement the history today.  Records and images were personally reviewed where available.  Since her last visit, she underwent Neuropsychological testing in 06/2017 which indicated mild dementia, likely vascular dementia. There was significant dysfunction in multiple domains, as well as evidence that cognitive impairment is interfering with her ability to manage complex tasks such as driving and managing finances. The stage of dementia, based on test results and current level of functioning, appears to be mild stage. Her cognitive profile is reflective of predominantly frontal-subcortical dysfunction which is commonly seen in vascular dementia due to small vessel disease.    Her daughter had called our office prior to the visit stating she believes that her mother is not being honest with her doctors. She reported her mother was still driving and that nothing will make her stop. The patient stayed with her over the holiday and her daughter was shocked at the patient's decline. She was becoming more and more irate, she has thrown things at people and smacked things out of people's hands. Per phone note, the daughter thinks patient is "trying to get one over her doctors."    She reports that she is not driving. She went to [facility] to have her driving test and was told she would be contacted, but has not heard back. She continues to live alone, her daughter [name] calls her multiple times a day. She states it is very seldom that she ever forgets to do something. She has a checklist when she takes her medications. She takes Aleve about every morning for headaches. She feels off balance when in the yard, but denies any falls. She denies  any further syncopal episodes since February 2018.   HPI 12/03/2016: This is an 88 yo RH woman with a history of hypertension, hyperlipidemia, brain aneurysm s/p clipping in the 1980s, presenting for evaluation of syncope, balance problems, and memory loss.    1. Syncope. She had a syncopal episode and was brought to the ER on 11/03/16. She states she had ran out of fuel and had no heat. Per ER notes, she was waving goodbye to family when she passed out with no prior warning symptoms. She denied any post-event confusion or focal weakness, no headache.She declined  to go to the ER but went 2 days later due to unsteady gait and decreased appetite. She felt this was similar to when she was found to have an aneurysm in the past. She had reported a month history of weakness as well and mild headache. Vital signs were stable, pulse was 58 bpm. Bloodwork unremarkable. She had a head CT which I personally reviewed, prior occipital craniotomy seen, with encephalomalacia in the inferior cerebellum, right>left; chronic microvascular disease, old right frontal burr hole probably due to ventriculostomy; enlarged ventricles, felt to be in proportion with progressive white matter disease. She was discharged home in stable condition and denies any further similar symptoms. She denies any staring/unresponsive episodes, gaps in time, olfactory/gustatory hallucinations, deja vu, rising epigastric sensation, focal numbness/tingling/weakness, myoclonic jerks. She denies any dizziness, palpitations, chest pain, shortness of breath. She has not been driving since then.   2. Balance problems: She and her sister do noticed some balance issues, she denies any falls but states she might wobble once in a while. She states she was told by her neurosurgeon that this may happen. She exercises and walks down her driveway. She denies any focal numbness/tingling/weakness, no neck/back pain, no bowel/bladder dysfunction.    3. Memory issues. She  and her sister report her memory is good. She lives with her daughter who is in charge of bills. She denies any missed medications and states she writes them down. When she was driving, she denied getting lost. Her sister lives close by and sees her on a daily basis. She states her mood is great, her sister says "It's hateful," she is impatient and gets upset when her sister does not pick her up on time. Her sister denies any personality changes. Her daughter sent a letter detailing her concerns. She has noticed the balance issues and states she has difficulties walking straight without staggering. With driving, she pulls out in front of traffic, often forgets directions, and is very nervous when she drives. She does not hear well, so her daughter is very worried about her driving. Her memory has declined greatly over the last year she has been living with her. She gets details confused or if someone recalls things differently or challenges her thoughts, she gets very hostile and angry. She has noticed an increase in her anxiety as well and she always seems to be on edge to those close to her. In the past 6 months, she forgets who people are and cannot recall conversations or even simple details and daily tasks. Her daughter takes care of all her finances, but she has become increasingly concerned because her mother is targeted daily by scammers and she is extremely confused about her finances. She personally thinks she needs to be listed incompetent to handle her own affairs. I reviewed [name] note as well, the patient did not recall [name] on her last visit, she has seen her several times in the past year. When asked today about her PCP, she answers [name] is her PCP and she has seen her several times and loves her. 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 She has occasional headaches on the frontal or left temporal region since  her brain surgery. She has horizontal diplopia without her glasses. No dizziness, dysarthria/dysphagia, neck/back pain, focal numbness/tingling/weakness, bowel/bladder dysfunction. No family history of dementia. Her father had a stroke. No history of concussions. She denies any alcohol  use     Current Outpatient Medications on File Prior to Visit  Medication Sig Dispense Refill   Cholecalciferol (VITAMIN D) 2000 units tablet Take 2,000 Units by mouth daily.     DERMA-SMOOTHE/FS SCALP 0.01 % OIL Apply 1 a small amount to affected area as directed  Apply to scalp leave in qhs, wash out in the am     ENSURE (ENSURE) Take 237 mLs by mouth at bedtime.     ketoconazole (NIZORAL) 2 % shampoo Shampoo with  a small amount as directed three times a week     memantine (NAMENDA) 10 MG tablet TAKE 1 TABLET (10 MG AT NIGHT) FOR 2 WEEKS, THEN INCREASE TO 1 TABLET (10 MG) TWICE A DAY 180 tablet 4   Multiple Vitamin (MULTIVITAMIN) capsule Take 1 capsule by mouth daily. (Patient not taking: Reported on 03/20/2022)     PARoxetine (PAXIL) 20 MG tablet TAKE 1 TABLET BY MOUTH EVERY DAY (Patient not taking: Reported on 03/20/2022) 90 tablet 1   Vitamin D, Ergocalciferol, (DRISDOL) 1.25 MG (50000 UNIT) CAPS capsule Take 1 capsule (50,000 Units total) by mouth every 7 (seven) days. 12 capsule 0   Vitamin D, Ergocalciferol, (DRISDOL) 1.25 MG (50000 UNIT) CAPS capsule Take 1 capsule (50,000 Units total) by mouth every 7 (seven) days. 7 capsule 12   No current facility-administered medications on file prior to visit.     Observations/Objective:   There were no vitals filed for this visit. GEN:  The patient appears stated age and is in NAD.  Neurological examination: Patient is awake, alert, not oriented to time, oriented to person and place.Fund of knowledge reduced.  No aphasia or dysarthria. Impaired fluency and comprehension. Remote and recent memory reduced, impaired. Unable to name and repeat. Cranial nerves:  Extraocular movements intact with no nystagmus. No facial asymmetry. Motor: moves all extremities symmetrically, at least anti-gravity x 4.  Normal finger to nose testing. Gait not tested, due to frequent falls and dementia.     Follow Up Instructions:    -I discussed the assessment and treatment plan with the patient. The patient was provided an opportunity to ask questions and all were answered. The patient agreed with the plan and demonstrated an understanding of the instructions.   The patient was advised to call back or seek an in-person evaluation if the symptoms worsen or if the condition fails to improve as anticipated.    Total time spent on today's visit was 101mnutes, including both face-to-face time and nonface-to-face time.  Time included that spent on review of records (prior notes available to me/labs/imaging if pertinent), discussing treatment and goals, answering patient's questions and coordinating care.   SSharene Butters PA-C

## 2022-09-05 ENCOUNTER — Telehealth (INDEPENDENT_AMBULATORY_CARE_PROVIDER_SITE_OTHER): Payer: Medicare HMO | Admitting: Physician Assistant

## 2022-09-05 DIAGNOSIS — F01518 Vascular dementia, unspecified severity, with other behavioral disturbance: Secondary | ICD-10-CM | POA: Diagnosis not present

## 2022-09-05 DIAGNOSIS — G309 Alzheimer's disease, unspecified: Secondary | ICD-10-CM

## 2022-09-05 DIAGNOSIS — F02818 Dementia in other diseases classified elsewhere, unspecified severity, with other behavioral disturbance: Secondary | ICD-10-CM

## 2022-09-05 DIAGNOSIS — F4321 Adjustment disorder with depressed mood: Secondary | ICD-10-CM | POA: Diagnosis not present

## 2022-09-05 MED ORDER — ESCITALOPRAM OXALATE 5 MG PO TABS: 5.0000 mg | ORAL_TABLET | Freq: Every day | ORAL | 3 refills | Status: DC

## 2022-09-05 NOTE — Progress Notes (Signed)
Virtual Visit via Video Note The purpose of this virtual visit is to provide medical care in a patient that is unable to be seen in person due to physical or health limitations   Consent was obtained for video visit:  yes  Answered questions that patient had about telehealth interaction:  yes I discussed the limitations, risks, security and privacy concerns of performing an evaluation and management service by telemedicine. I also discussed with the patient that there may be a patient responsible charge related to this service. The patient expressed understanding and agreed to proceed.  Pt location: Home Physician Location: office Name of referring provider:  Midge Minium, MD I connected with Breanna Yang at patients initiation/request on 09/05/2022 at 12:30 PM EST by video enabled telemedicine application and verified that I am speaking with the correct person using two identifiers. Pt MRN:  [id number] Pt DOB:  01/10/1934 Video Participants:  [name]; daughter Breanna Yang    Assessment and Plan:    88 y.o. 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 Cognitive decline is present, with last MoCA on 02/2022 at 1/30. CT head 03/2022 personally reviewed showed suboccipital craniectomy changes  with encephalomalacia in the posterior fossa mostly in the right cerebellum, small area of encephalomalacia in the right frontal lobe. Small old lacunar infarcts in the bilateral basal ganglia, extensive patchy hypodensities likely secondary to chronic microvascular ischemic changes and post-surgical changes.  She is on memantine 10 mg twice daily.  In today's visit, the patient's memory may be slightly  worse, still has not recovered from her sister's death, which may contribute to some of her cognitive issues.  The patient is not on any mood medication, however, the situational depression may be hindering her cognitive status, for which she is now felt prudent to initiate.  Continue memantine 10 mg bid .  Start Lexapro 5 mg daily for situational depression, will consider increasing it to 10 mg daily pending on how she tolerates this dose. Follow up in 3 months virtual visit   Update:  Some days are better than others Patient is seen today on virtual visit.  She was last seen on July 07, 2022.  She is on memantine 10 mg twice daily, tolerating well.  Since her last visit however, her daughter reports that her memory may be worse, she still have not improved her mood since her sister's death (she was very close to her).  She is not on antidepressants or any other mood medications.  She has some days that are better than others from the cognitive standpoint.  Long-term memory is fairly good.  She does need some time to think about words, eventually will come to her.  She denies any hallucinations or paranoia.  The appetite is fairly good, and is provided by [employer] Wheels, complemented by Ensure.  Her daughter reports that she does not drink enough water "3 little bottles ".  She sleeps well, she goes to sleep by 8 PM.  There are no hygiene concerns reported.  However, she feels uncomfortable getting into the tub, and her mother has to assist her with bathing and dressing "she is very stubborn".  She has a sitter 2 times a week from 9:30  AM till 2 and another after 5 PM.  During the weekends her daughter and cousin are in charge of her.  Caregiver is in charge of the medications.  Daughter is in charge of the finances.  No recent UTIs or GI complaints.  No recent strokelike symptoms.  Of note, she is no longer on PT, her daughter reports that these did not help.     Initial history of Present Illness  January 2023:   How long did patient have memory difficulties?  For at least 5 years, worse over the last 6 months. She had lost to follow up for unclear reasons-sister said she was feeling "fine". She had not been on any memory medications. In fact she had been prescribed Aricept back then, but she has not taken it. "She has a hard time having the word coming out. She has forgotten the dates"-sister reports .  Patient lives with: her daughter during the day, and she spends the night with her sister but is still insistent to come to her daughter's house during the day. Sometimes she is alone and at that point she is at the risk of wandering off.  repeats oneself? Endorsed, more frequently over the last 6 months."I want to have breakfast" was repeated several times during this visit  Disoriented when walking into a room? Endorsed: "where are we at?" She asks her family frequently   Leaving objects in unusual places?  Patient and sister deny  Ambulates  with difficulty? She appears to be deconditioned, her daughter has bought her 3 different walkers and she refuses to use them. She is at risk for falls Recent falls?  Endorsed. "She has fallen several times in the yard when trying to do things she really shouldn't be doing". Falls occur mostly when she bends down to pick flowers or something from the ground.. She never had PT/OT   Any head injuries?  "My head hurts in the top where I have a hole, and also in the back of the head sometimes" no vertigo, nausea, vomiting or dizziness, no syncope. History of seizures?   Patient denies   Wandering behavior? Daughter reports that recently she has began to wander around the neighborhood and has even crossed [place] to walk to the store [phone]) "One time she was found in the woods .   Patient drives?   Patient no longer drives   Any mood changes such irritability agitation?  Daughter reports that she is combative towards her and her  sister when she does not get her way. She "tells [place] foul words a lot" Any history of depression?:  Patient denies   Hallucinations? Sister Denies Paranoia?  Patient denies   Patient reports that he sleeps well without vivid dreams, REM behavior or sleepwalking   History of sleep apnea?  Patient denies   Any hygiene concerns?  Patient denies. Her hygiene is impeccable   Independent of bathing and dressing?  Endorsed, although sometimes she sleeps with her street clothes.   Does the patient needs help with medications?  Daughter is in charge Who is in charge of the finances?  Daughter is in charge    Any changes in appetite?  She don't eat like she did, she forgets to eat sometimes    Patient have trouble swallowing? Patient denies   Does the patient cook?  Patient denies   Any kitchen accidents such as leaving the stove on? Patient denies   Any headaches? Endorsed. "Since the  aneurysm, takes tylenol sometimes and goes away"  Double vision? Patient denies   Any focal numbness or tingling?  Patient denies   Chronic back pain Patient denies   Unilateral weakness?  Patient denies   Any tremors?  Patient denies   Any history of anosmia?  Patient denies   Any incontinence of urine?  Patient denies   Any bowel dysfunction?   Patient denies   History of heavy alcohol intake?  Patient denies   History of heavy tobacco use?  Patient denies   Family history of dementia?  3 brothers with dementia  55 brothers and sisters. Widowed. She retired after having her aneurysm in 1985. She used to make wedding and prom dresses     CT head 11/05/16 Old posterior fossa aneurysm clipping on the right. Occipital craniectomy. 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 Right frontal encephalomalacia due to previous ventriculostomy. Atrophy and chronic small-vessel ischemic changes of the white matter, progressive since 2008. The ventricles are larger, but this is roughly in proportion to the progressive  white matter disease.     Visit 10/07/2017, Dr. Candie Mile had the pleasure of seeing Savonna Birchmeier in follow-up in the neurology clinic on 10/07/2017.  The patient was last seen 6 months ago for syncope, balance problems, and memory loss. She is accompanied by her daughter who helps supplement the history today.  Records and images were personally reviewed where available.  Since her last visit, she underwent Neuropsychological testing in 06/2017 which indicated mild dementia, likely vascular dementia. There was significant dysfunction in multiple domains, as well as evidence that cognitive impairment is interfering with her ability to manage complex tasks such as driving and managing finances. The stage of dementia, based on test results and current level of functioning, appears to be mild stage. Her cognitive profile is reflective of predominantly frontal-subcortical dysfunction which is commonly seen in vascular dementia due to small vessel disease.    Her daughter had called our office prior to the visit stating she believes that her mother is not being honest with her doctors. She reported her mother was still driving and that nothing will make her stop. The patient stayed with her over the holiday and her daughter was shocked at the patient's decline. She was becoming more and more irate, she has thrown things at people and smacked things out of people's hands. Per phone note, the daughter thinks patient is "trying to get one over her doctors."    She reports that she is not driving. She went to the [facility] to have her driving test and was told she would be contacted, but has not heard back. She continues to live alone, her daughter [name] calls her multiple times a day. She states it is very seldom that she ever forgets to do something. She has a checklist when she takes her medications. She takes Aleve about every morning for headaches. She feels off balance when in the yard, but denies any falls. She denies  any further syncopal episodes since February 2018.   HPI 12/03/2016: This is an 88 yo RH woman with a history of hypertension, hyperlipidemia, brain aneurysm s/p clipping in the 1980s, presenting for evaluation of syncope, balance problems, and memory loss.    1. Syncope. She had a syncopal episode and was brought to the ER on 11/03/16. She states she had ran out of fuel and had no heat. Per ER notes, she was waving goodbye to family when she passed out with no  prior warning symptoms. She denied any post-event confusion or focal weakness, no headache.She declined to go to the ER but went 2 days later due to unsteady gait and decreased appetite. She felt this was similar to when she was found to have an aneurysm in the past. She had reported a month history of weakness as well and mild headache. Vital signs were stable, pulse was 58 bpm. Bloodwork unremarkable. She had a head CT which I personally reviewed, prior occipital craniotomy seen, with encephalomalacia in the inferior cerebellum, right>left; chronic microvascular disease, old right frontal burr hole probably due to ventriculostomy; enlarged ventricles, felt to be in proportion with progressive white matter disease. She was discharged home in stable condition and denies any further similar symptoms. She denies any staring/unresponsive episodes, gaps in time, olfactory/gustatory hallucinations, deja vu, rising epigastric sensation, focal numbness/tingling/weakness, myoclonic jerks. She denies any dizziness, palpitations, chest pain, shortness of breath. She has not been driving since then.   2. Balance problems: She and her sister do noticed some balance issues, she denies any falls but states she might wobble once in a while. She states she was told by her neurosurgeon that this may happen. She exercises and walks down her driveway. She denies any focal numbness/tingling/weakness, no neck/back pain, no bowel/bladder dysfunction.    3. Memory issues. She  and her sister report her memory is good. She lives with her daughter who is in charge of bills. She denies any missed medications and states she writes them down. When she was driving, she denied getting lost. Her sister lives close by and sees her on a daily basis. She states her mood is great, her sister says "It's hateful," she is impatient and gets upset when her sister does not pick her up on time. Her sister denies any personality changes. Her daughter sent a letter detailing her concerns. She has noticed the balance issues and states she has difficulties walking straight without staggering. With driving, she pulls out in front of traffic, often forgets directions, and is very nervous when she drives. She does not hear well, so her daughter is very worried about her driving. Her memory has declined greatly over the last year she has been living with her. She gets details confused or if someone recalls things differently or challenges her thoughts, she gets very hostile and angry. She has noticed an increase in her anxiety as well and she always seems to be on edge to those close to her. In the past 6 months, she forgets who people are and cannot recall conversations or even simple details and daily tasks. Her daughter takes care of all her finances, but she has become increasingly concerned because her mother is targeted daily by scammers and she is extremely confused about her finances. She personally thinks she needs to be listed incompetent to handle her own affairs. I reviewed [name] note as well, the patient did not recall [name] on her last visit, she has seen her several times in the past year. When asked today about her PCP, she answers [name] is her PCP and she has seen her several times and loves her. 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 She has occasional headaches on the frontal or left temporal region since  her brain surgery. She has horizontal diplopia without her glasses. No dizziness, dysarthria/dysphagia, neck/back pain, focal numbness/tingling/weakness, bowel/bladder dysfunction. No family history of  dementia. Her father had a stroke. No history of concussions. She denies any alcohol use     Current Outpatient Medications on File Prior to Visit  Medication Sig Dispense Refill   Cholecalciferol (VITAMIN D) 2000 units tablet Take 2,000 Units by mouth daily.     DERMA-SMOOTHE/FS SCALP 0.01 % OIL Apply 1 a small amount to affected area as directed  Apply to scalp leave in qhs, wash out in the am     ENSURE (ENSURE) Take 237 mLs by mouth at bedtime.     ketoconazole (NIZORAL) 2 % shampoo Shampoo with  a small amount as directed three times a week     memantine (NAMENDA) 10 MG tablet TAKE 1 TABLET (10 MG AT NIGHT) FOR 2 WEEKS, THEN INCREASE TO 1 TABLET (10 MG) TWICE A DAY 180 tablet 4   Multiple Vitamin (MULTIVITAMIN) capsule Take 1 capsule by mouth daily. (Patient not taking: Reported on 03/20/2022)     PARoxetine (PAXIL) 20 MG tablet TAKE 1 TABLET BY MOUTH EVERY DAY (Patient not taking: Reported on 03/20/2022) 90 tablet 1   Vitamin D, Ergocalciferol, (DRISDOL) 1.25 MG (50000 UNIT) CAPS capsule Take 1 capsule (50,000 Units total) by mouth every 7 (seven) days. 12 capsule 0   Vitamin D, Ergocalciferol, (DRISDOL) 1.25 MG (50000 UNIT) CAPS capsule Take 1 capsule (50,000 Units total) by mouth every 7 (seven) days. 7 capsule 12   No current facility-administered medications on file prior to visit.     Observations/Objective:   There were no vitals filed for this visit. GEN:  The patient appears stated age and is in NAD.  Neurological examination: Patient is awake, alert, not oriented to time, oriented to person and place.Fund of knowledge reduced.  No aphasia or dysarthria. Impaired fluency and comprehension. Remote and recent memory reduced, impaired. Unable to name and repeat. Cranial nerves:  Extraocular movements intact with no nystagmus. No facial asymmetry. Motor: moves all extremities symmetrically, at least anti-gravity x 4.  Normal finger to nose testing. Gait not tested, due to frequent falls and dementia.     Follow Up Instructions:    -I discussed the assessment and treatment plan with the patient. The patient was provided an opportunity to ask questions and all were answered. The patient agreed with the plan and demonstrated an understanding of the instructions.   The patient was advised to call back or seek an in-person evaluation if the symptoms worsen or if the condition fails to improve as anticipated.    Total time spent on today's visit was 61mnutes, including both face-to-face time and nonface-to-face time.  Time included that spent on review of records (prior notes available to me/labs/imaging if pertinent), discussing treatment and goals, answering patient's questions and coordinating care.   SSharene Butters PA-C

## 2022-09-10 ENCOUNTER — Encounter: Payer: Self-pay | Admitting: Physician Assistant

## 2022-09-28 ENCOUNTER — Other Ambulatory Visit: Payer: Self-pay | Admitting: Physician Assistant

## 2022-10-06 ENCOUNTER — Encounter: Payer: Self-pay | Admitting: Family Medicine

## 2022-10-07 ENCOUNTER — Encounter: Payer: Self-pay | Admitting: Physician Assistant

## 2022-10-07 NOTE — Telephone Encounter (Signed)
Made pt a virtual apt for 10/17/22 @ 740 am

## 2022-10-09 ENCOUNTER — Other Ambulatory Visit: Payer: Self-pay | Admitting: Physician Assistant

## 2022-10-17 ENCOUNTER — Telehealth: Payer: Medicare HMO | Admitting: Family Medicine

## 2023-01-06 ENCOUNTER — Other Ambulatory Visit: Payer: Self-pay | Admitting: Physician Assistant

## 2023-01-21 ENCOUNTER — Encounter (HOSPITAL_COMMUNITY): Payer: Self-pay

## 2023-01-21 ENCOUNTER — Emergency Department (HOSPITAL_COMMUNITY): Payer: Medicare HMO

## 2023-01-21 ENCOUNTER — Other Ambulatory Visit: Payer: Self-pay

## 2023-01-21 ENCOUNTER — Emergency Department (HOSPITAL_COMMUNITY)
Admission: EM | Admit: 2023-01-21 | Discharge: 2023-01-21 | Disposition: A | Discharge status: Home / Self Care | Payer: Medicare HMO | Attending: Emergency Medicine | Admitting: Emergency Medicine

## 2023-01-21 DIAGNOSIS — M25552 Pain in left hip: Secondary | ICD-10-CM | POA: Diagnosis not present

## 2023-01-21 DIAGNOSIS — R531 Weakness: Secondary | ICD-10-CM | POA: Insufficient documentation

## 2023-01-21 DIAGNOSIS — R29898 Other symptoms and signs involving the musculoskeletal system: Secondary | ICD-10-CM | POA: Diagnosis not present

## 2023-01-21 DIAGNOSIS — I10 Essential (primary) hypertension: Secondary | ICD-10-CM | POA: Insufficient documentation

## 2023-01-21 DIAGNOSIS — W19XXXA Unspecified fall, initial encounter: Secondary | ICD-10-CM | POA: Diagnosis not present

## 2023-01-21 DIAGNOSIS — R5383 Other fatigue: Secondary | ICD-10-CM | POA: Diagnosis not present

## 2023-01-21 DIAGNOSIS — R55 Syncope and collapse: Secondary | ICD-10-CM | POA: Diagnosis not present

## 2023-01-21 LAB — URINALYSIS, ROUTINE W REFLEX MICROSCOPIC
Bacteria, UA: NONE SEEN
Bilirubin Urine: NEGATIVE
Glucose, UA: NEGATIVE mg/dL
Hgb urine dipstick: NEGATIVE
Ketones, ur: NEGATIVE mg/dL
Nitrite: NEGATIVE
Protein, ur: NEGATIVE mg/dL
Specific Gravity, Urine: 1.013 (ref 1.005–1.030)
pH: 6 (ref 5.0–8.0)

## 2023-01-21 LAB — CBC WITH DIFFERENTIAL/PLATELET
Abs Immature Granulocytes: 0.01 10*3/uL (ref 0.00–0.07)
Basophils Absolute: 0 10*3/uL (ref 0.0–0.1)
Basophils Relative: 0 %
Eosinophils Absolute: 0 10*3/uL (ref 0.0–0.5)
Eosinophils Relative: 1 %
HCT: 28 % — ABNORMAL LOW (ref 36.0–46.0)
Hemoglobin: 9.6 g/dL — ABNORMAL LOW (ref 12.0–15.0)
Immature Granulocytes: 0 %
Lymphocytes Relative: 30 %
Lymphs Abs: 1 10*3/uL (ref 0.7–4.0)
MCH: 33.4 pg (ref 26.0–34.0)
MCHC: 34.3 g/dL (ref 30.0–36.0)
MCV: 97.6 fL (ref 80.0–100.0)
Monocytes Absolute: 0.3 10*3/uL (ref 0.1–1.0)
Monocytes Relative: 10 %
Neutro Abs: 2.1 10*3/uL (ref 1.7–7.7)
Neutrophils Relative %: 59 %
Platelets: 144 10*3/uL — ABNORMAL LOW (ref 150–400)
RBC: 2.87 MIL/uL — ABNORMAL LOW (ref 3.87–5.11)
RDW: 13.1 % (ref 11.5–15.5)
WBC: 3.5 10*3/uL — ABNORMAL LOW (ref 4.0–10.5)
nRBC: 0 % (ref 0.0–0.2)

## 2023-01-21 LAB — BASIC METABOLIC PANEL
Anion gap: 10 (ref 5–15)
BUN: 17 mg/dL (ref 8–23)
CO2: 23 mmol/L (ref 22–32)
Calcium: 9.2 mg/dL (ref 8.9–10.3)
Chloride: 105 mmol/L (ref 98–111)
Creatinine, Ser: 1.05 mg/dL — ABNORMAL HIGH (ref 0.44–1.00)
GFR, Estimated: 51 mL/min — ABNORMAL LOW (ref >60)
Glucose, Bld: 97 mg/dL (ref 70–99)
Potassium: 3.7 mmol/L (ref 3.5–5.1)
Sodium: 138 mmol/L (ref 135–145)

## 2023-01-21 LAB — CBG MONITORING, ED: Glucose-Capillary: 82 mg/dL (ref 70–99)

## 2023-01-21 NOTE — ED Provider Notes (Signed)
[place] EMERGENCY DEPARTMENT AT [facility] Provider Note   CSN: [id number] Arrival date & time: 01/21/23  1111     History  Chief Complaint  Patient presents with   Loss of Consciousness   Weakness    Breanna Yang is a 89 y.o. female.  The history is provided by the patient, a relative, medical records and the EMS personnel. No language interpreter was used.  Loss of Consciousness Associated symptoms: weakness   Weakness Associated symptoms: syncope      89 year old female significant history of hypertension, depression, osteoporosis, history of syncope brought here via EMS from home accompanied by daughter for evaluation of increased weakness.  Per daughter, patient lives at home by herself however she does have 3 home health caregivers to care for her.  For the past week since patient is eating less and they also noticed increased weakness.  Today she had a brief syncopal episode that was witnessed while she was sitting on her couch.  She did not fall down to the ground.  She does have known history of benign syncope in the past which is not unusual for her.  No report of any new medication changes.  Daughter felt patient is experiencing increased depression or at least decreasing her daily activities since the death of her sister last year.  EMS noted that her urine has strong odor.  EMS initially reported difficulty lifting patient's left leg and when patient was standing she was complaining of left hip and pelvis pain.  Caregiver report patient had a prior falls injuring her right ankle and head but was not evaluated at that time.  Also report that patient has been eating and drinking much due to not having around-the-clock care.  Home Medications Prior to Admission medications   Medication Sig Start Date End Date Taking? Authorizing Provider  Cholecalciferol (VITAMIN D) 2000 units tablet Take 2,000 Units by mouth daily.    [provider]   DERMA-SMOOTHE/FS SCALP 0.01 % OIL Apply 1 a small amount to affected area as directed  Apply to scalp leave in qhs, wash out in the am 03/07/20   [provider]  ENSURE (ENSURE) Take 237 mLs by mouth at bedtime.    [provider]  escitalopram (LEXAPRO) 5 MG tablet TAKE 1 TABLET (5 MG TOTAL) BY MOUTH DAILY. 01/06/23   Marcos Eke, PA-C  ketoconazole (NIZORAL) 2 % shampoo Shampoo with  a small amount as directed three times a week 03/07/20   [provider]  memantine (NAMENDA) 10 MG tablet TAKE 1 TABLET (10 MG AT NIGHT) FOR 2 WEEKS, THEN INCREASE TO 1 TABLET (10 MG) TWICE A DAY 03/21/22   Marcos Eke, PA-C  Multiple Vitamin (MULTIVITAMIN) capsule Take 1 capsule by mouth daily. Patient not taking: Reported on 03/20/2022    [provider]  Vitamin D, Ergocalciferol, (DRISDOL) 1.25 MG (50000 UNIT) CAPS capsule Take 1 capsule (50,000 Units total) by mouth every 7 (seven) days. 01/06/22   Sheliah Hatch, MD  Vitamin D, Ergocalciferol, (DRISDOL) 1.25 MG (50000 UNIT) CAPS capsule Take 1 capsule (50,000 Units total) by mouth every 7 (seven) days. 06/03/22   Sheliah Hatch, MD      Allergies    Patient has no known allergies.    Review of Systems   Review of Systems  Cardiovascular:  Positive for syncope.  Neurological:  Positive for weakness.  All other systems reviewed and are negative.   Physical Exam Updated Vital  Signs BP (!) 154/93 (BP Location: Left Arm)   Pulse 70   Temp 98.1 F (36.7 C) (Oral)   Resp 18   Ht  (1.651 m)   Wt 47.6 kg   SpO2 100%   BMI 17.47 kg/m  Physical Exam Vitals and nursing note reviewed.  Constitutional:      General: She is not in acute distress.    Appearance: She is well-developed.  HENT:     Head: Atraumatic.  Eyes:     Conjunctiva/sclera: Conjunctivae normal.  Cardiovascular:     Rate and Rhythm: Normal rate and regular rhythm.  Pulmonary:     Effort: Pulmonary effort is normal.  Abdominal:      Palpations: Abdomen is soft.     Tenderness: There is no abdominal tenderness.  Musculoskeletal:     Cervical back: Neck supple.  Skin:    Findings: No rash.  Neurological:     Mental Status: She is alert. Mental status is at baseline.     Comments: Able to ambulate with assist  Psychiatric:        Mood and Affect: Mood normal.     ED Results / Procedures / Treatments   Labs (all labs ordered are listed, but only abnormal results are displayed) Labs Reviewed  BASIC METABOLIC PANEL - Abnormal; Notable for the following components:      Result Value   Creatinine, Ser 1.05 (*)    GFR, Estimated 51 (*)    All other components within normal limits  URINALYSIS, ROUTINE W REFLEX MICROSCOPIC - Abnormal; Notable for the following components:   Leukocytes,Ua TRACE (*)    All other components within normal limits  CBC WITH DIFFERENTIAL/PLATELET - Abnormal; Notable for the following components:   WBC 3.5 (*)    RBC 2.87 (*)    Hemoglobin 9.6 (*)    HCT 28.0 (*)    Platelets 144 (*)    All other components within normal limits  CBG MONITORING, ED  TROPONIN I (HIGH SENSITIVITY)    EKG EKG Interpretation  Date/Time:  Wednesday January 21 2023 11:10:53 EDT Ventricular Rate:  67 PR Interval:  172 QRS Duration: 72 QT Interval:  406 QTC Calculation: 429 R Axis:   51 Text Interpretation: Normal sinus rhythm Nonspecific ST and T wave abnormality Abnormal ECG When compared with ECG of 05-Nov-2016 16:03, No significant change since last tracing Confirmed by Meridee Score [phone]) on 01/21/2023 12:08:14 PM  Radiology DG Chest 1 View  Result Date: 01/21/2023 CLINICAL DATA:  Recent syncopal episode EXAM: PORTABLE CHEST 1 VIEW COMPARISON:  05/18/2005 FINDINGS: Cardiac shadow is within normal limits. Aortic calcifications are seen. The lungs are well aerated bilaterally. No focal infiltrate or effusion is seen. No acute bony abnormality is noted. IMPRESSION: No active disease. Electronically  Signed   By: Alcide Clever M.D.   On: 01/21/2023 13:10   DG Hip Unilat W or Wo Pelvis 2-3 Views Left  Result Date: 01/21/2023 CLINICAL DATA:  Left hip pain post fall today. EXAM: DG HIP (WITH OR WITHOUT PELVIS) 2-3V LEFT COMPARISON:  None Available. FINDINGS: There is no evidence of hip fracture or dislocation. Superolateral hip joint space narrowing with marginal spurring. Degenerate disc disease of the lower lumbar spine. IMPRESSION: 1. No acute fracture or dislocation. 2. Mild to moderate left hip osteoarthritis. Electronically Signed   By: Larose Hires D.O.   On: 01/21/2023 12:33    Procedures Procedures    Medications Ordered in ED Medications - No data  to display  ED Course/ Medical Decision Making/ A&P Clinical Course as of 01/21/23 1445  Wed Jan 21, 2023  [id number] 89 year old female with weakness falls decreased ability to ambulate.  Getting lab work imaging.  Disposition per results of testing. [MB]    Clinical Course User Index [MB] Terrilee Files, MD                             Medical Decision Making Amount and/or Complexity of Data Reviewed Labs: ordered. Radiology: ordered.   BP (!) 154/93 (BP Location: Left Arm)   Pulse 70   Temp 98.1 F (36.7 C) (Oral)   Resp 18   Ht  (1.651 m)   Wt 47.6 kg   SpO2 100%   BMI 17.47 kg/m   64:67 PM 89 year old female significant history of hypertension, depression, osteoporosis, history of syncope brought here via EMS from home accompanied by daughter for evaluation of increased weakness.  Per daughter, patient lives at home by herself however she does have 3 home health caregivers to care for her.  For the past week since patient is eating less and they also noticed increased weakness.  Today she had a brief syncopal episode that was witnessed while she was sitting on her couch.  She did not fall down to the ground.  She does have known history of benign syncope in the past which is not unusual for her.  No report of any new  medication changes.  Daughter felt patient is experiencing increased depression or at least decreasing her daily activities since the death of her sister last year.  EMS noted that her urine has strong odor.  EMS initially reported difficulty lifting patient's left leg and when patient was standing she was complaining of left hip and pelvis pain.  Caregiver report patient had a prior falls injuring her right ankle and head but was not evaluated at that time.  Also report that patient has been eating and drinking much due to not having around-the-clock care.  At this time patient reports she does not have any pain and has no other complaint.  On exam, patient was able to ambulate with assist.  She appears to be in no acute discomfort.  No tenderness about the scalp or head, no midline spine tenderness, no tenderness to her chest abdomen or pelvis, she is able to move all 4 extremities with equal strength and no tenderness to hips.  Heart with normal rate and rhythm, lungs are clear to auscultation abdomen is soft and nontender with an abdominal bruit noted  Vital sign review, patient is mildly hypertensive with a blood pressure of 154/93.  She is afebrile no hypoxia.  -Labs ordered, independently viewed and interpreted by me.  Labs remarkable for hgb 9.6, improves from prior.  -The patient was maintained on a cardiac monitor.  I personally viewed and interpreted the cardiac monitored which showed an underlying rhythm of: NSR -Imaging independently viewed and interpreted by me and I agree with radiologist's interpretation.  Result remarkable for xray of L hip/pelvis and CXR showing no acuter finding.   -This patient presents to the ED for concern of weakness, this involves an extensive number of treatment options, and is a complaint that carries with it a high risk of complications and morbidity.  The differential diagnosis includes hypoglycemia, electrolytes imbalance, anemia, UTI, infection, ACS,  stroke -Co morbidities that complicate the patient evaluation includes HTN, dementia, depression -Treatment  includes none -Reevaluation of the patient after these medicines showed that the patient improved -PCP office notes or outside notes reviewed -Discussion with specialist attending DR. Charm Barges -Escalation to admission/observation considered: patients feels much better, is comfortable with discharge, and will follow up with PCP -Prescription medication considered, patient comfortable with home medication -Social Determinant of Health considered  Per daughter, patient appears to be doing better since early this morning.  She is ambulating at her baseline.  She is without any other complaint.  Patient truly did not have any focal neurodeficits to suggest TIA/stroke.  Workup overall reassuring.  No concerning cardiac arrhythmia, no electrolyte imbalance, vital sign overall stable.  After further discussion, we will discharge home with outpatient follow-up.  Return precaution given.         Final Clinical Impression(s) / ED Diagnoses Final diagnoses:  Weakness    Rx / DC Orders ED Discharge Orders     None         Fayrene Helper, PA-C 01/21/23 1455    Terrilee Files, MD 01/21/23 1726

## 2023-01-21 NOTE — ED Provider Triage Note (Signed)
Emergency Medicine Provider Triage Evaluation Note  Breanna Yang , a 89 y.o. female  was evaluated in triage.  Pt complains of weakness and fatigue for the past few days even too weak to walk today. Had a 15 second syncope episode with EMS and per the daughter has an expansive history of similar syncope with benign etiology. HX of dementia, HTN/HLD, complex syncope in the past Has daytime caregiver assist.  Review of Systems  Positive: Weakness/fatigue/syncope Negative:   Physical Exam  BP (!) 154/93 (BP Location: Left Arm)   Pulse 70   Temp 98.1 F (36.7 C) (Oral)   Resp 18   Ht  (1.651 m)   Wt 47.6 kg   SpO2 100%   BMI 17.47 kg/m  Gen:   Awake, no distress   Resp:  Normal effort  MSK:   Moves extremities without difficulty  Other:    Medical Decision Making  Medically screening exam initiated at 11:38 AM.  Appropriate orders placed.  Lilley Hubble Ransdell was informed that the remainder of the evaluation will be completed by another provider, this initial triage assessment does not replace that evaluation, and the importance of remaining in the ED until their evaluation is complete.     Glyn Ade, MD 01/21/23 [phone]

## 2023-01-21 NOTE — ED Triage Notes (Signed)
[facility] ems from home caregiver called due to increased weakness and mobility over 2 days.  Upon ems arrival alert and oriented to person which is baseline.  Caregiver reports yesterday n oticed she was not able to ambulate with her walker which is not normal.  Ems reports difficulty lifting left leg.  EMS attempted to stand her up and she reports patient started complaining of left hip/pelvis pain.  Caregiver reports prior falls with right ankle injury and head injury but was not elevated.  EMS reports went to sit her down and patient had syncopal episode lasting 15 secs.  Decreased po intake due to not having around the clock care.  Foul smelling urine.

## 2023-01-26 ENCOUNTER — Encounter: Payer: Self-pay | Admitting: Family Medicine

## 2023-01-26 ENCOUNTER — Telehealth: Payer: Self-pay

## 2023-01-26 ENCOUNTER — Encounter: Payer: Self-pay | Admitting: Physician Assistant

## 2023-01-26 NOTE — Telephone Encounter (Signed)
Transition Care Management Follow-up Telephone Call Date of discharge and from where: [facility] 01/21/2023 How have you been since you were released from the hospital? As good as she can be  Any questions or concerns? No  Items Reviewed: Did the pt receive and understand the discharge instructions provided? Yes  Medications obtained and verified? Yes  Other? No  Any new allergies since your discharge? No  Dietary orders reviewed? No Do you have support at home? Yes   Follow up appointments reviewed:  PCP Hospital f/u appt confirmed? No  Scheduled to see  on  @ . Specialist Hospital f/u appt confirmed? No  Scheduled to see  on  @ . Are transportation arrangements needed? No  If their condition worsens, is the pt aware to call PCP or go to the Emergency Dept.? Yes Was the patient provided with contact information for the PCP's office or ED? Yes Was to pt encouraged to call back with questions or concerns? Yes

## 2023-01-27 NOTE — Telephone Encounter (Signed)
Called patient daughter Breanna Yang to get scheduled, noted she could only do 5/9 or 5/10 and only between 12-1 was advised you are out of the office that date, and we are on lunch from 12-1 for lunch so it can be difficult to get appointments we could not find one in her requested periods, explained why she couldn't see another provider for these requests and forms and then patient daughter proceeded to tell me it is not going to work that "Olegario Messier" is never available and always on vacation as well as never accommodating, I informed her of Dr Wille Glaser hours and Dawn decided at this time she will be seeking care else where for her mother a provider who will move their schedule around to accommodate their needs.

## 2023-01-29 NOTE — Telephone Encounter (Signed)
Noted.  Please remove me as PCP 

## 2023-01-30 ENCOUNTER — Other Ambulatory Visit: Payer: Self-pay | Admitting: Physician Assistant

## 2023-03-20 ENCOUNTER — Ambulatory Visit (INDEPENDENT_AMBULATORY_CARE_PROVIDER_SITE_OTHER): Payer: Medicare HMO | Admitting: Family Medicine

## 2023-03-20 ENCOUNTER — Encounter: Payer: Self-pay | Admitting: Family Medicine

## 2023-03-20 VITALS — BP 106/60 | HR 85 | Temp 97.9°F | Resp 17 | Ht 65.0 in | Wt 116.4 lb

## 2023-03-20 DIAGNOSIS — G309 Alzheimer's disease, unspecified: Secondary | ICD-10-CM

## 2023-03-20 DIAGNOSIS — F01518 Vascular dementia, unspecified severity, with other behavioral disturbance: Secondary | ICD-10-CM

## 2023-03-20 DIAGNOSIS — F02818 Dementia in other diseases classified elsewhere, unspecified severity, with other behavioral disturbance: Secondary | ICD-10-CM | POA: Diagnosis not present

## 2023-03-20 NOTE — Progress Notes (Unsigned)
   Subjective:    Patient ID: ANTIGONE CROWELL, female    DOB: 1933/12/14, 89 y.o.   MRN: [id number]  HPI Dementia- pt reports feeling 'great'.  Says she's going to 'stay at the house'.  Has 3 caregivers- 1 for the morning for meds and breakfast, meal on wheels comes in early afternoon, then someone comes around 4:30 for dinner and nightly meds.  Daughter installed cameras but pt only has company for ~5-6 hrs/day.  Daughter reports she is finding soiled adult diapers hidden all over the house.  Sister who lived next door passed away last year and pt has been struggling w/ depression.  She has been refusing to move in w/ daughter.  They cannot afford a memory care facility at this time.   Review of Systems For ROS see HPI     Objective:   Physical Exam Vitals reviewed.  Constitutional:      General: She is not in acute distress.    Appearance: Normal appearance. She is not ill-appearing.  Neurological:     Mental Status: She is alert. She is disoriented.     Comments: Not oriented to person, place, or time.  Able to identify daughter and then just as quickly didn't know who she was.  Kept talking about her sister, Lendon Collar, who died ~20 yrs ago.  Would agree to move for safety and then refuse.  Psychiatric:     Comments: Rapidly cycling mood and ideas.  Will alternate between 'you're so nice' to 'shut up.  I don't want to hear from you'.           Assessment & Plan:

## 2023-03-20 NOTE — Patient Instructions (Signed)
Follow up as needed Home Health will call you to set up a time for evaluation It is not safe for you to be home by yourself You are very lucky to be able to move in with your daughter Moving is for your health, safety, and emotional well being Call with any questions of concerns Have a [place]!

## 2023-03-21 NOTE — Assessment & Plan Note (Signed)
Deteriorated.  Pt is clearly not safe to live alone.  She is not oriented to person, place, or time.  Needs to be constantly redirected.  Daughter has POA.  Encouraged daughter to take pt to daughter's house today and not allow her to return home.  If she returns home, she likely won't be able to get her to leave again.  Explained over and over to patient that moving is for her safety and out of concern for her well being.  She would initially agree and then refuse.  She lacks capacity to make her own decisions.  Will order [facility] nursing for an assessment and a HH aide.  Daughter is agreeable to make the move today and then move her belongings at a later time.  If pt ends up back home alone, will need to call APS.  Total time spent w/ pt and daughter, 46 minutes.

## 2023-03-23 ENCOUNTER — Emergency Department (HOSPITAL_COMMUNITY)
Admission: EM | Admit: 2023-03-23 | Discharge: 2023-03-23 | Discharge status: Against Medical Advice | Payer: Medicare HMO | Attending: Emergency Medicine | Admitting: Emergency Medicine

## 2023-03-23 ENCOUNTER — Other Ambulatory Visit: Payer: Self-pay

## 2023-03-23 ENCOUNTER — Encounter (HOSPITAL_COMMUNITY): Payer: Self-pay

## 2023-03-23 ENCOUNTER — Emergency Department (HOSPITAL_COMMUNITY): Payer: Medicare HMO

## 2023-03-23 ENCOUNTER — Telehealth: Payer: Self-pay | Admitting: Family Medicine

## 2023-03-23 DIAGNOSIS — R531 Weakness: Secondary | ICD-10-CM | POA: Diagnosis not present

## 2023-03-23 DIAGNOSIS — R0989 Other specified symptoms and signs involving the circulatory and respiratory systems: Secondary | ICD-10-CM | POA: Diagnosis not present

## 2023-03-23 DIAGNOSIS — G309 Alzheimer's disease, unspecified: Secondary | ICD-10-CM | POA: Diagnosis not present

## 2023-03-23 DIAGNOSIS — Z5321 Procedure and treatment not carried out due to patient leaving prior to being seen by health care provider: Secondary | ICD-10-CM | POA: Diagnosis not present

## 2023-03-23 DIAGNOSIS — R109 Unspecified abdominal pain: Secondary | ICD-10-CM | POA: Diagnosis not present

## 2023-03-23 DIAGNOSIS — R55 Syncope and collapse: Secondary | ICD-10-CM | POA: Insufficient documentation

## 2023-03-23 DIAGNOSIS — Z1152 Encounter for screening for COVID-19: Secondary | ICD-10-CM | POA: Diagnosis not present

## 2023-03-23 DIAGNOSIS — F0280 Dementia in other diseases classified elsewhere without behavioral disturbance: Secondary | ICD-10-CM | POA: Diagnosis not present

## 2023-03-23 DIAGNOSIS — G9389 Other specified disorders of brain: Secondary | ICD-10-CM | POA: Diagnosis not present

## 2023-03-23 DIAGNOSIS — R4182 Altered mental status, unspecified: Secondary | ICD-10-CM | POA: Diagnosis not present

## 2023-03-23 DIAGNOSIS — I517 Cardiomegaly: Secondary | ICD-10-CM | POA: Diagnosis not present

## 2023-03-23 DIAGNOSIS — F02818 Dementia in other diseases classified elsewhere, unspecified severity, with other behavioral disturbance: Secondary | ICD-10-CM

## 2023-03-23 DIAGNOSIS — R410 Disorientation, unspecified: Secondary | ICD-10-CM | POA: Insufficient documentation

## 2023-03-23 DIAGNOSIS — I6782 Cerebral ischemia: Secondary | ICD-10-CM | POA: Diagnosis not present

## 2023-03-23 DIAGNOSIS — F4489 Other dissociative and conversion disorders: Secondary | ICD-10-CM | POA: Diagnosis not present

## 2023-03-23 LAB — COMPREHENSIVE METABOLIC PANEL
ALT: 11 U/L (ref 0–44)
AST: 19 U/L (ref 15–41)
Albumin: 3.7 g/dL (ref 3.5–5.0)
Alkaline Phosphatase: 52 U/L (ref 38–126)
Anion gap: 8 (ref 5–15)
BUN: 23 mg/dL (ref 8–23)
CO2: 24 mmol/L (ref 22–32)
Calcium: 9.4 mg/dL (ref 8.9–10.3)
Chloride: 104 mmol/L (ref 98–111)
Creatinine, Ser: 1.16 mg/dL — ABNORMAL HIGH (ref 0.44–1.00)
GFR, Estimated: 46 mL/min — ABNORMAL LOW (ref >60)
Glucose, Bld: 88 mg/dL (ref 70–99)
Potassium: 3.8 mmol/L (ref 3.5–5.1)
Sodium: 136 mmol/L (ref 135–145)
Total Bilirubin: 0.4 mg/dL (ref 0.3–1.2)
Total Protein: 8.4 g/dL — ABNORMAL HIGH (ref 6.5–8.1)

## 2023-03-23 LAB — CBC
HCT: 28.8 % — ABNORMAL LOW (ref 36.0–46.0)
Hemoglobin: 9.5 g/dL — ABNORMAL LOW (ref 12.0–15.0)
MCH: 32.4 pg (ref 26.0–34.0)
MCHC: 33 g/dL (ref 30.0–36.0)
MCV: 98.3 fL (ref 80.0–100.0)
Platelets: 162 10*3/uL (ref 150–400)
RBC: 2.93 MIL/uL — ABNORMAL LOW (ref 3.87–5.11)
RDW: 12.9 % (ref 11.5–15.5)
WBC: 4.6 10*3/uL (ref 4.0–10.5)
nRBC: 0 % (ref 0.0–0.2)

## 2023-03-23 LAB — SARS CORONAVIRUS 2 BY RT PCR: SARS Coronavirus 2 by RT PCR: NEGATIVE

## 2023-03-23 LAB — MAGNESIUM: Magnesium: 2 mg/dL (ref 1.7–2.4)

## 2023-03-23 LAB — LIPASE, BLOOD: Lipase: 46 U/L (ref 11–51)

## 2023-03-23 LAB — CBG MONITORING, ED: Glucose-Capillary: 92 mg/dL (ref 70–99)

## 2023-03-23 LAB — TROPONIN I (HIGH SENSITIVITY): Troponin I (High Sensitivity): 3 ng/L (ref <18)

## 2023-03-23 NOTE — Telephone Encounter (Signed)
Pt's daughter called and stated that [facility] home health states they do not do home health that the pt needs that the order needed to say pt needs Home Care . The pt daughter states the pt had her cousin call her daughter today to say that her late sister and husband came to visit her and told her it was time and they where coming to get her . She is not eating nor drinking at this point per daughter . She is asking if we can place a hospice referral ? 

## 2023-03-23 NOTE — ED Notes (Signed)
Pt left. 

## 2023-03-23 NOTE — ED Triage Notes (Signed)
Pt BIB [facility] from home for increased confusion, weakness, and abd pain. Pt h/x advanced dementia.

## 2023-03-23 NOTE — Telephone Encounter (Signed)
I agree w/ calling 911 if she is in severe pain but I did not know that was happening

## 2023-03-23 NOTE — Telephone Encounter (Signed)
Phone note in

## 2023-03-23 NOTE — Telephone Encounter (Signed)
I have informed her daughter of the hospice referral .  When I spoke to her she advised [place] that they called 911 on the pt for extreme stomach pain .

## 2023-03-23 NOTE — ED Provider Triage Note (Signed)
Emergency Medicine Provider Triage Evaluation Note  Breanna Yang , a 89 y.o. female  was evaluated in triage.  Pt complains of generalized weakness, near syncope, falls, and increasing confusion.  The history was provided by the patient's daughter who is also her healthcare POA.  She states that she lives alone and has home health nursing.  She has a history of Alzheimer's dementia and has been having increasing difficulty with confusion over the last few days.  She feels that she probably needs placement out of his memory care facility at this point because she is no longer performing her ADLs at home.  She had an episode of near syncope and a fall at home over the weekend which was stopped because her daughter caught her before she fully fell.  She also complained of mild abdominal discomfort to EMS.  No vomiting, unclear when her last bowel movement was  Review of Systems  Positive: Near syncope, confusion, generalized weakness, abdominal pain Negative: Chest pain, fevers, chills, nausea, vomiting  Physical Exam  BP 106/67   Pulse 86   Temp 99 F (37.2 C) (Oral)   Resp 17   Ht 5\' 5"  (1.651 m)   Wt 52.6 kg   SpO2 99%   BMI 19.30 kg/m  Gen:   Awake, no distress  Pleasantly demented Resp:  Normal effort  CV:  No murmurs, well perfused MSK:   Moves extremities without difficulty  Neuro:  No cranial nerve deficit, 5 out of 5 strength in all 4 extremities with intact sensation to light touch Abd:  Soft, nontender, nondistended, no rebound or guarding   Medical Decision Making  Medically screening exam initiated at 4:25 PM.  Appropriate orders placed.  Chelse Matas [place] was informed that the remainder of the evaluation will be completed by another provider, this initial triage assessment does not replace that evaluation, and the importance of remaining in the ED until their evaluation is complete.     Ernie Avena, MD 03/23/23 [phone]

## 2023-03-23 NOTE — Telephone Encounter (Signed)
Referral placed for Hospice 

## 2023-03-23 NOTE — Telephone Encounter (Signed)
Caller name: Algernon Huxley   On DPR?: Yes  Call back number: [phone]  Provider they see: Sheliah Hatch, MD  Reason for call:  Would like to talk about homecare for her mother.

## 2023-03-24 ENCOUNTER — Other Ambulatory Visit: Payer: Self-pay | Admitting: Physician Assistant

## 2023-03-25 ENCOUNTER — Encounter: Payer: Self-pay | Admitting: Family Medicine

## 2023-04-19 ENCOUNTER — Other Ambulatory Visit: Payer: Self-pay | Admitting: Physician Assistant

## 2023-04-20 ENCOUNTER — Other Ambulatory Visit: Payer: Self-pay | Admitting: Physician Assistant

## 2023-04-20 ENCOUNTER — Encounter: Payer: Self-pay | Admitting: Physician Assistant

## 2023-04-20 MED ORDER — MEMANTINE HCL 10 MG PO TABS: 10.0000 mg | ORAL_TABLET | Freq: Two times a day (BID) | ORAL | 3 refills | Status: DC

## 2023-04-20 MED ORDER — ESCITALOPRAM OXALATE 5 MG PO TABS: 5.0000 mg | ORAL_TABLET | Freq: Every day | ORAL | 3 refills | Status: DC

## 2023-06-27 ENCOUNTER — Other Ambulatory Visit: Payer: Self-pay | Admitting: Family Medicine

## 2023-07-24 IMAGING — US US BREAST*R* LIMITED INC AXILLA
1 series · 6 of 6 positions shown · non-contrast
Comparison: Previous exam(s).

CLINICAL DATA: Short-term follow-up for a probably benign mass in
right breast. Patient was initially a call back from screening for a
possible right breast mass. Diagnostic imaging demonstrated a
possible normal intramammary lymph node to correspond to the mass,
but also detected a probably benign mass on ultrasound at 12
o'clock, 2 cm the nipple. Short-term follow-up was recommended.

EXAM:
DIGITAL DIAGNOSTIC UNILATERAL RIGHT MAMMOGRAM WITH TOMOSYNTHESIS AND
CAD; ULTRASOUND RIGHT BREAST LIMITED
TECHNIQUE: Right digital diagnostic mammography and breast tomosynthesis was
performed. The images were evaluated with computer-aided detection.;
Targeted ultrasound examination of the right breast was performed

[Series 1: us breast*right* limited inc axilla · 0.06mm/px · 6 of 6 slices shown]
[im 1/6]
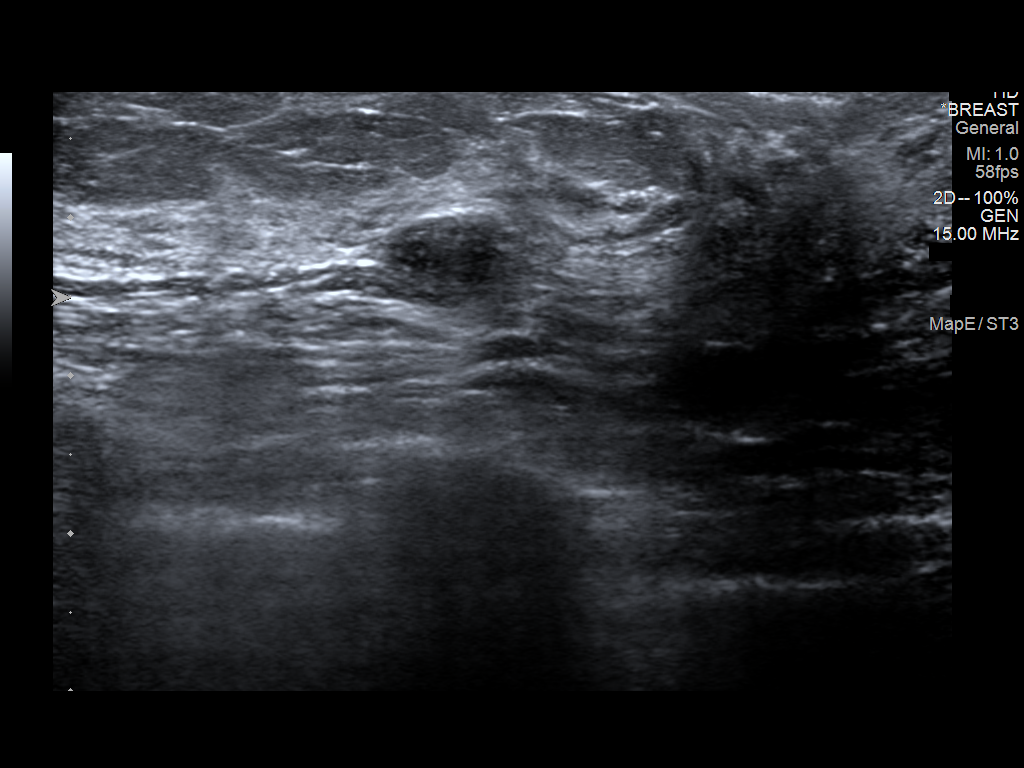
[im 2/6]
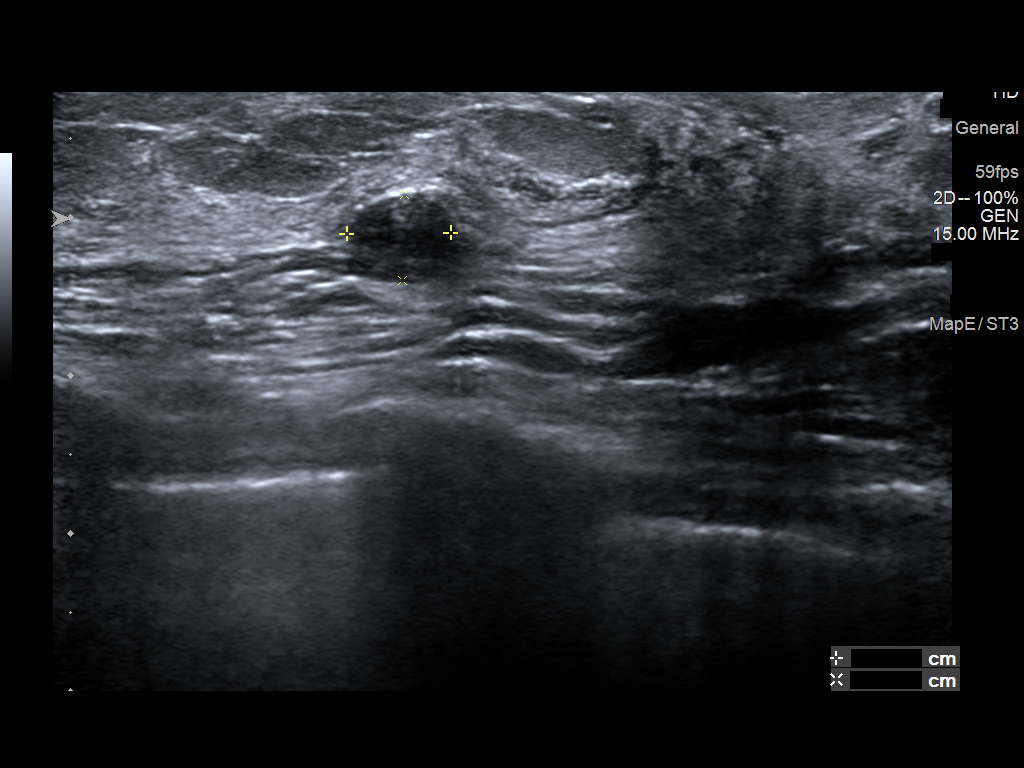
[im 3/6]
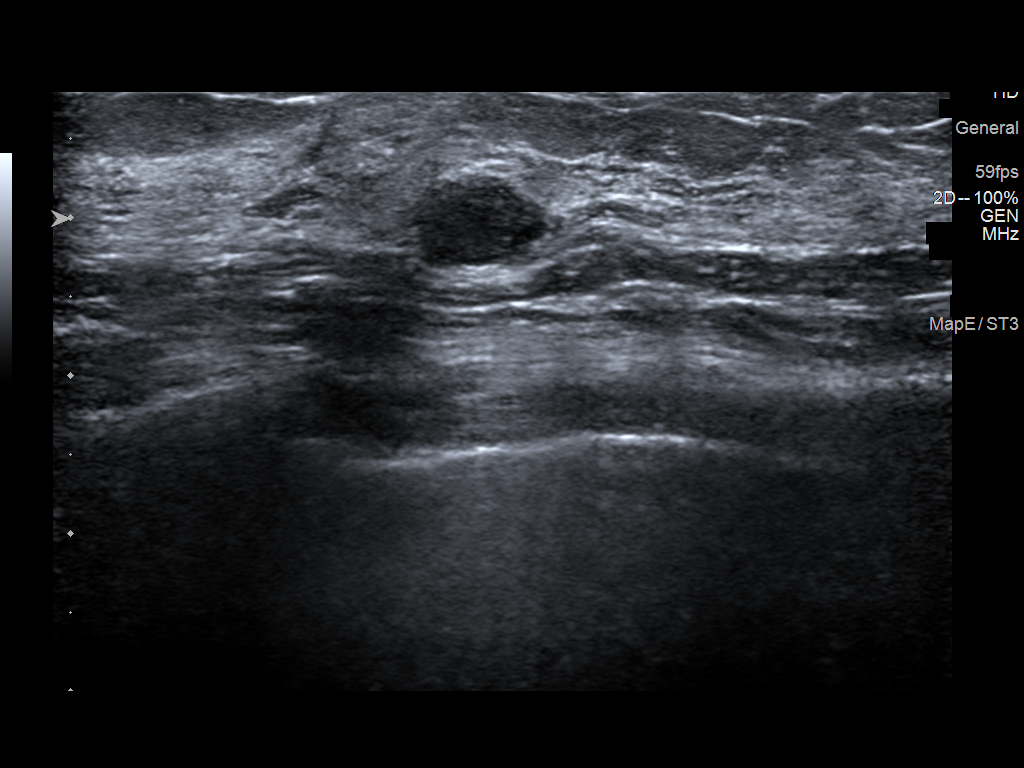
[im 4/6]
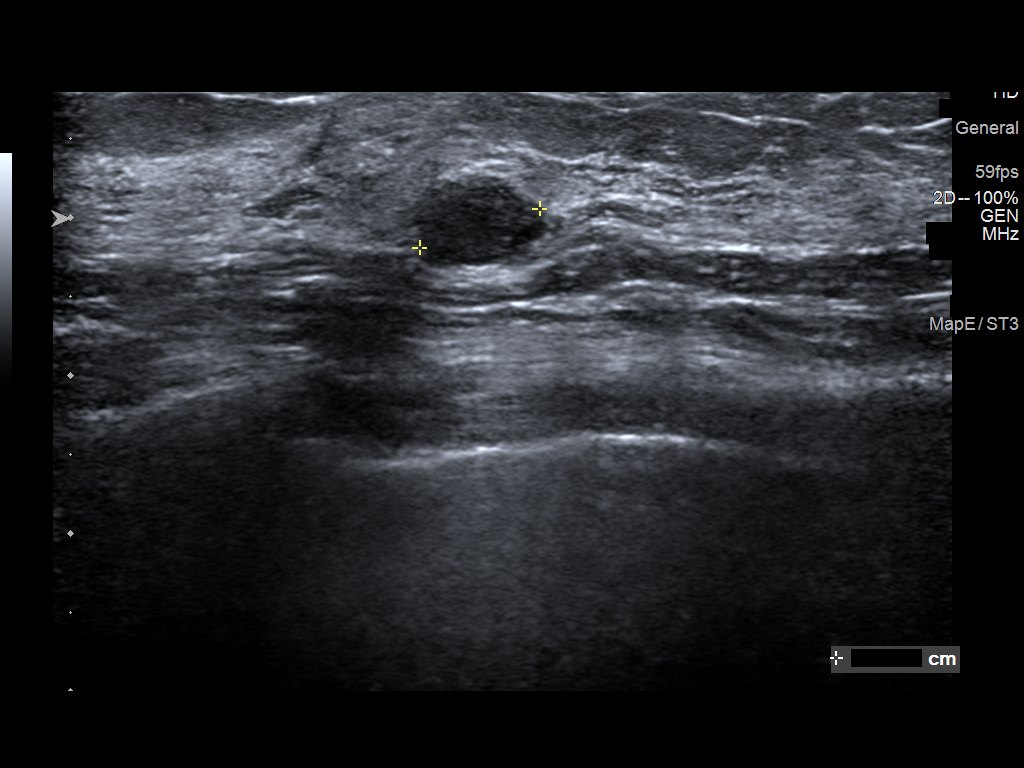
[im 5/6]
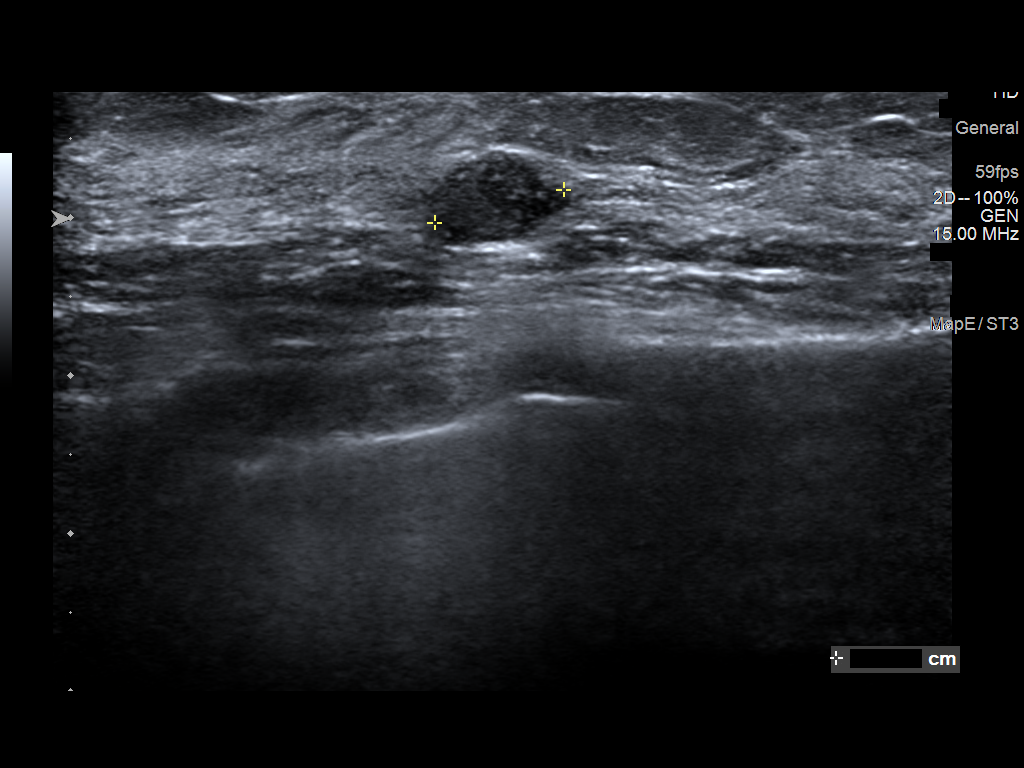
[im 6/6]
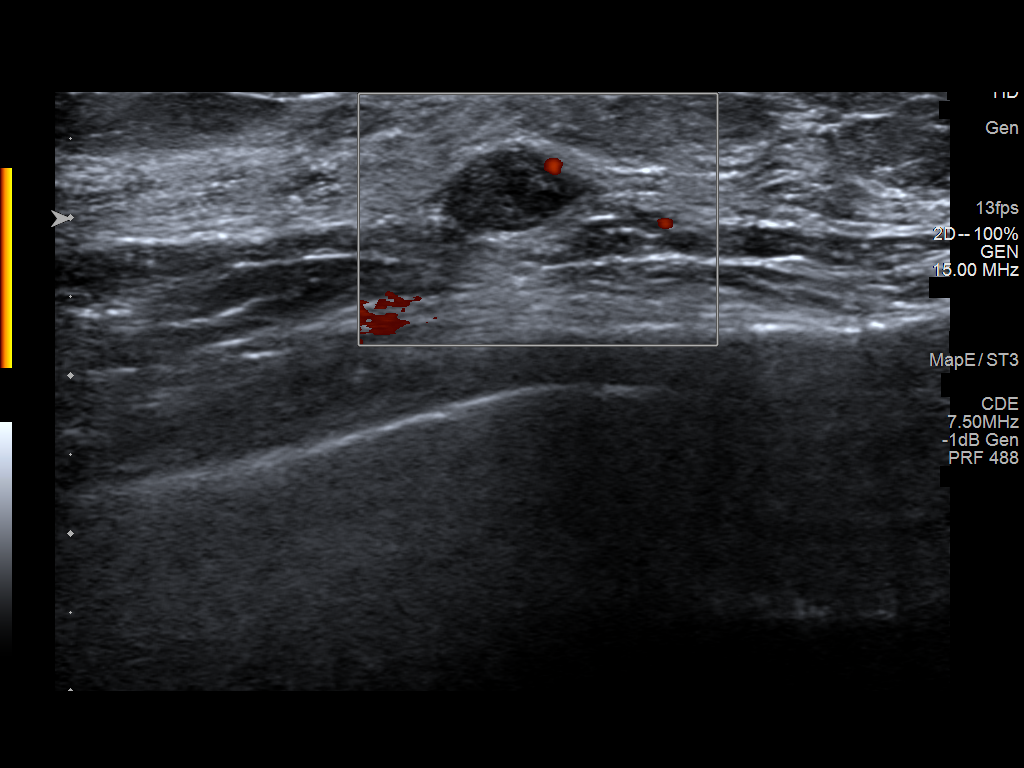

[6 of 6 positions shown; findings below may reference images not displayed]

ACR Breast Density Category c: The breast tissue is heterogeneously
dense, which may obscure small masses.
FINDINGS: The mass noted in the posterior, upper right breast the prior
screening and diagnostic MLO images is less apparent. There are no
new masses, no areas of architectural distortion and no suspicious
calcifications.

Targeted right breast ultrasound is performed, showing an oval,
circumscribed, hypoechoic mass with increased through transmission
in the right breast at 12 o'clock, 2 cm the nipple, measuring 8 x 5
x 7 mm, previously 9 x 4 x 7 mm.
IMPRESSION: 1. Probably benign mass right breast at 12 o'clock likely a
complicated cyst, without significant change exam dated 02/06/2021.
Additional short-term follow-up recommended.

RECOMMENDATION:
1. Diagnostic bilateral mammography and right breast ultrasound in 6
months.

I have discussed the findings and recommendations with the patient.
If applicable, a reminder letter will be sent to the patient
regarding the next appointment.

BI-RADS CATEGORY  3: Probably benign.

## 2023-07-24 IMAGING — MG MM DIGITAL DIAGNOSTIC UNILAT*R* W/ TOMO W/ CAD
6 series · 6 of 18 positions shown · non-contrast
Comparison: Previous exam(s).

CLINICAL DATA: Short-term follow-up for a probably benign mass in
right breast. Patient was initially a call back from screening for a
possible right breast mass. Diagnostic imaging demonstrated a
possible normal intramammary lymph node to correspond to the mass,
but also detected a probably benign mass on ultrasound at 12
o'clock, 2 cm the nipple. Short-term follow-up was recommended.

EXAM:
DIGITAL DIAGNOSTIC UNILATERAL RIGHT MAMMOGRAM WITH TOMOSYNTHESIS AND
CAD; ULTRASOUND RIGHT BREAST LIMITED
TECHNIQUE: Right digital diagnostic mammography and breast tomosynthesis was
performed. The images were evaluated with computer-aided detection.;
Targeted ultrasound examination of the right breast was performed

[R MLO synth-2D (1 of 2)]
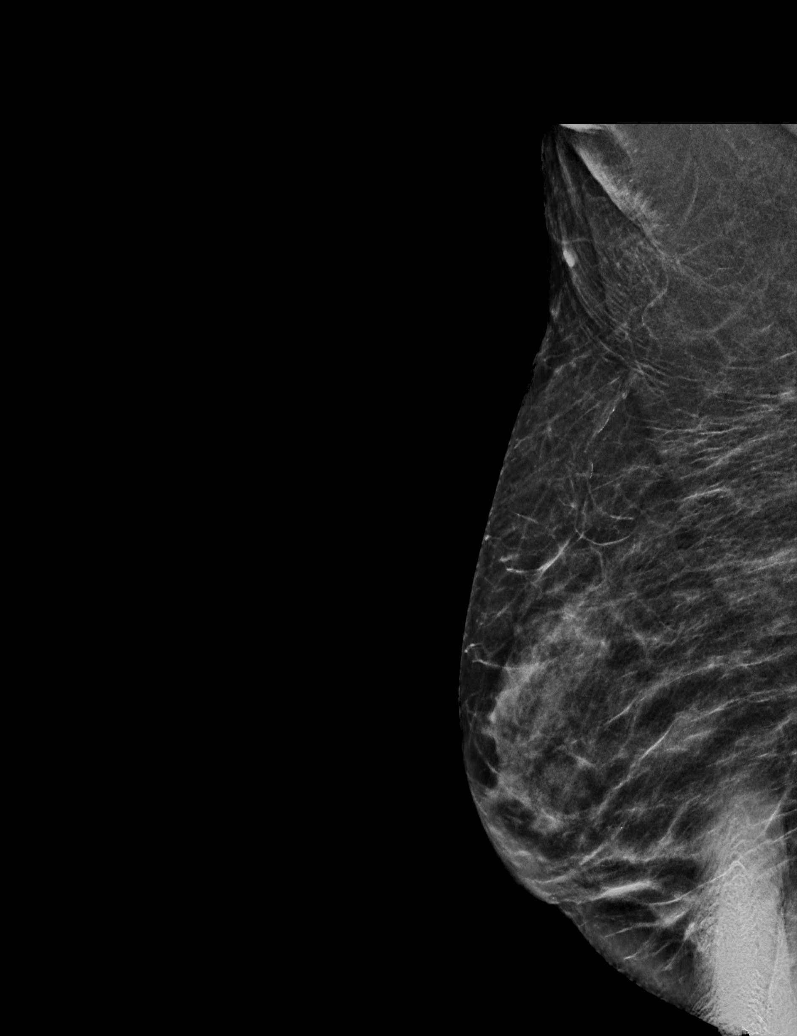

[R MLO synth-2D (2 of 2)]
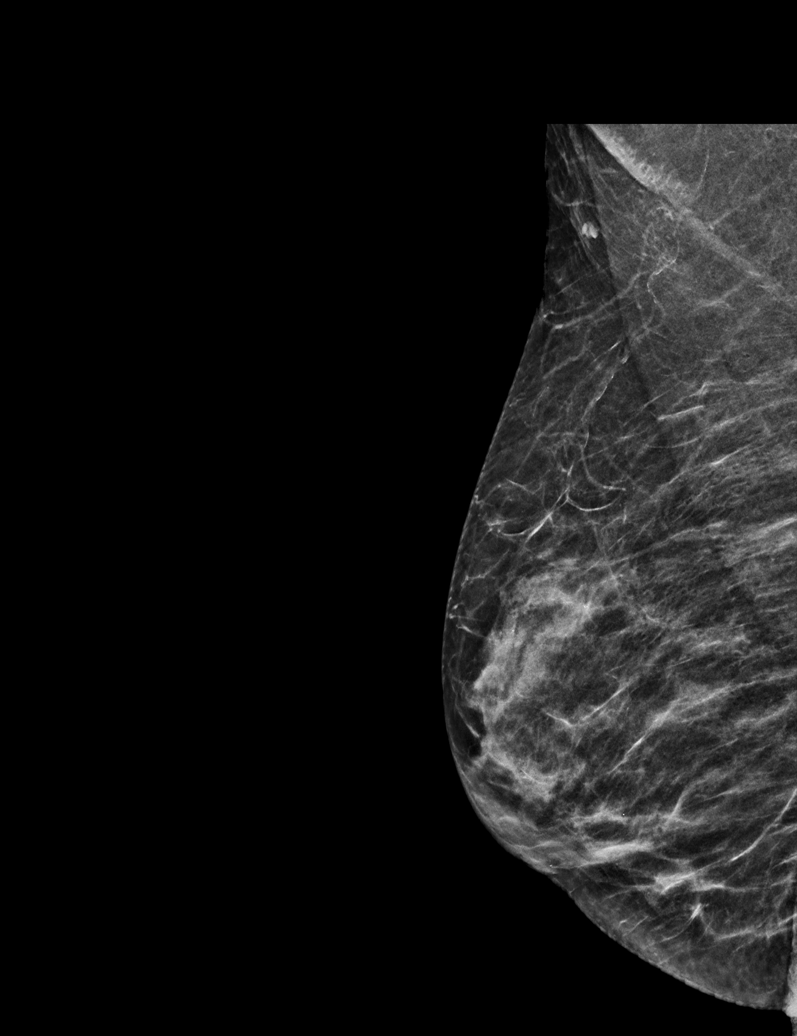

[R CC synth-2D]
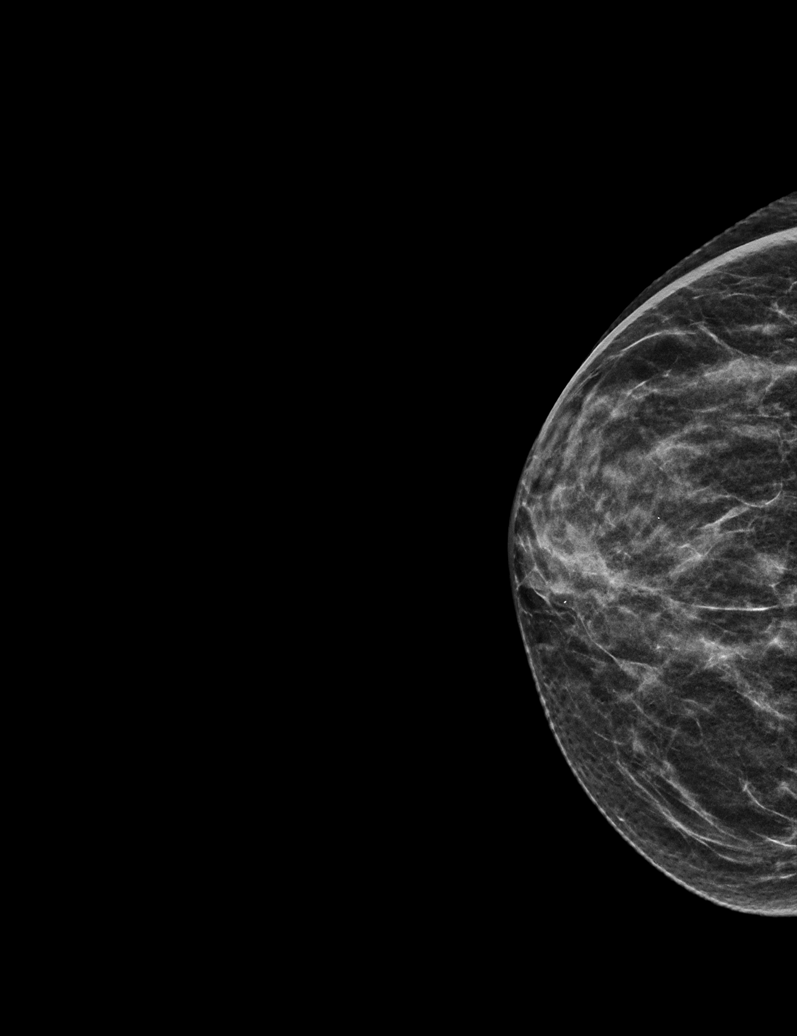

[R MLO tomo (1 of 2) · tomo slice 23/44.0]
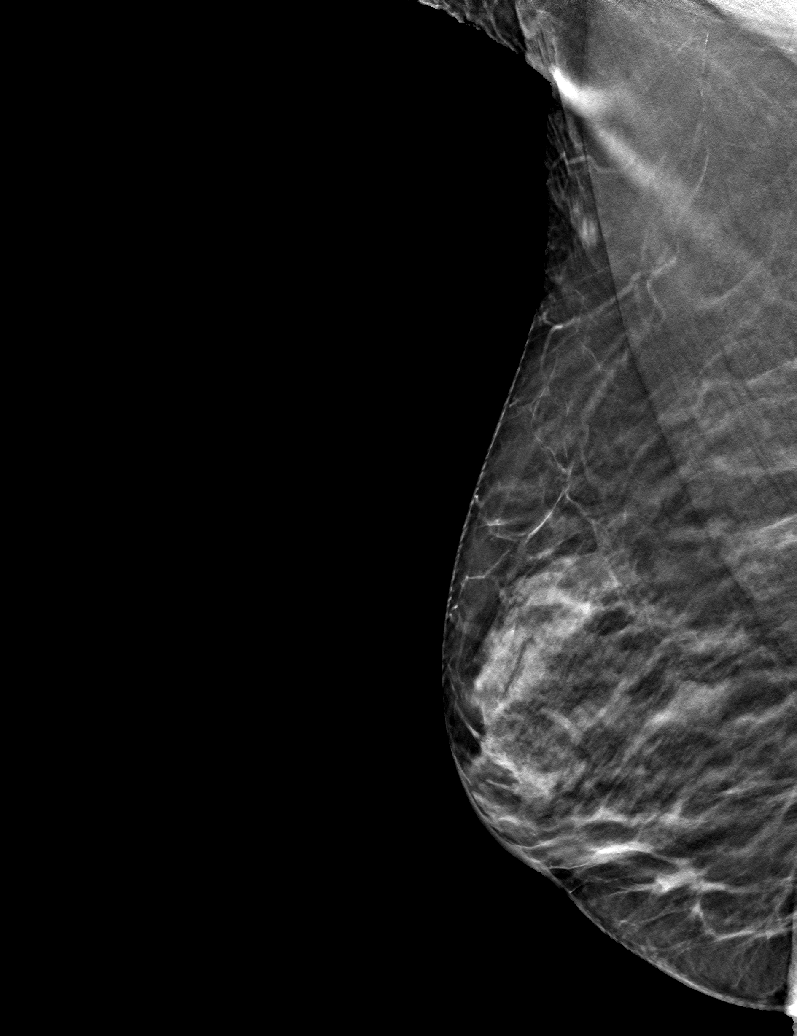

[R MLO tomo (2 of 2) · tomo slice 24/47.0]
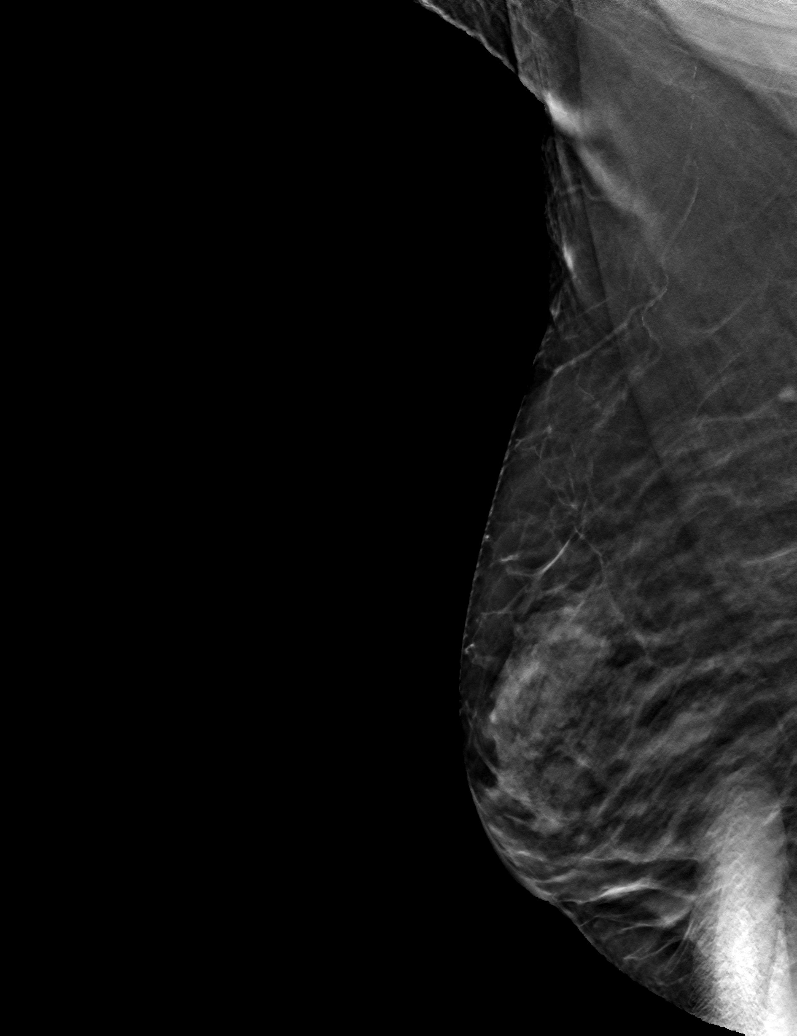

[R CC tomo · tomo slice 19/38.0]
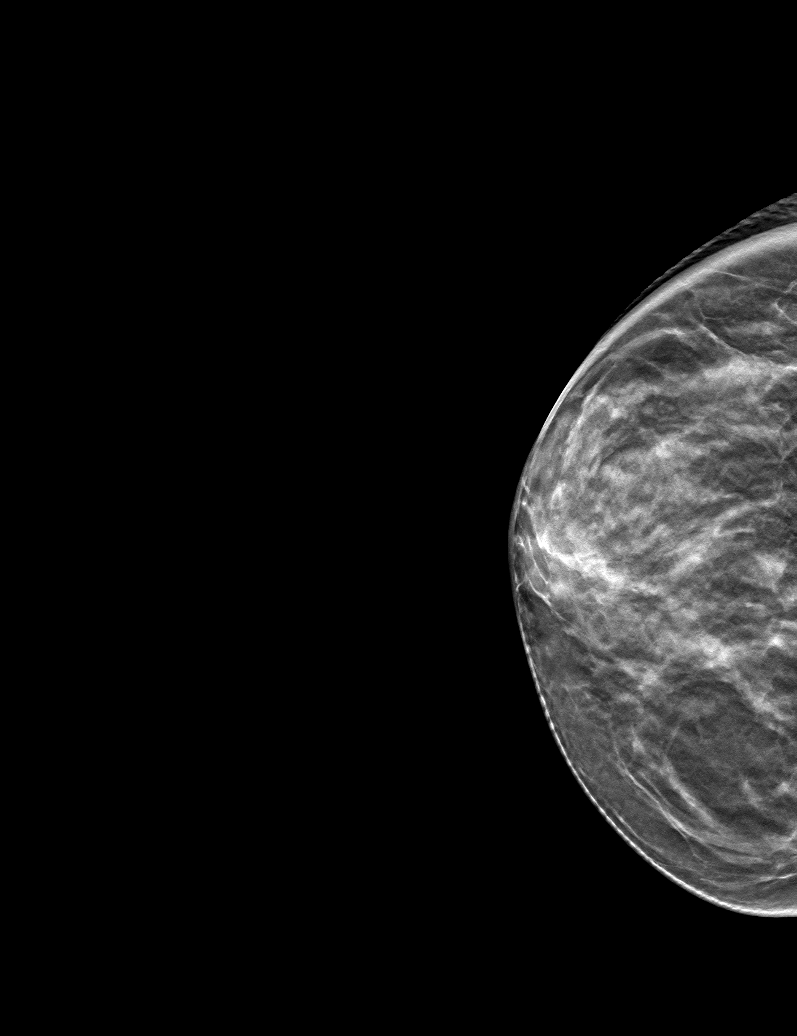

[6 of 18 positions shown; findings below may reference images not displayed]

ACR Breast Density Category c: The breast tissue is heterogeneously
dense, which may obscure small masses.
FINDINGS: The mass noted in the posterior, upper right breast the prior
screening and diagnostic MLO images is less apparent. There are no
new masses, no areas of architectural distortion and no suspicious
calcifications.

Targeted right breast ultrasound is performed, showing an oval,
circumscribed, hypoechoic mass with increased through transmission
in the right breast at 12 o'clock, 2 cm the nipple, measuring 8 x 5
x 7 mm, previously 9 x 4 x 7 mm.
IMPRESSION: 1. Probably benign mass right breast at 12 o'clock likely a
complicated cyst, without significant change exam dated 02/06/2021.
Additional short-term follow-up recommended.

RECOMMENDATION:
1. Diagnostic bilateral mammography and right breast ultrasound in 6
months.

I have discussed the findings and recommendations with the patient.
If applicable, a reminder letter will be sent to the patient
regarding the next appointment.

BI-RADS CATEGORY  3: Probably benign.

## 2023-11-28 DEATH — deceased
